# Patient Record
Sex: Female | Born: 1985 | Race: Black or African American | Hispanic: No | Marital: Single | Smoking: Former smoker
Health system: Southern US, Community
[De-identification: ages and names within clinical notes are randomized; demographics above are authoritative.]

## PROBLEM LIST (undated history)

## (undated) ENCOUNTER — Inpatient Hospital Stay (HOSPITAL_COMMUNITY): Payer: Self-pay

## (undated) DIAGNOSIS — R87619 Unspecified abnormal cytological findings in specimens from cervix uteri: Secondary | ICD-10-CM

## (undated) DIAGNOSIS — F419 Anxiety disorder, unspecified: Secondary | ICD-10-CM

## (undated) DIAGNOSIS — F32A Depression, unspecified: Secondary | ICD-10-CM

## (undated) DIAGNOSIS — A749 Chlamydial infection, unspecified: Secondary | ICD-10-CM

## (undated) DIAGNOSIS — IMO0002 Reserved for concepts with insufficient information to code with codable children: Secondary | ICD-10-CM

## (undated) DIAGNOSIS — F329 Major depressive disorder, single episode, unspecified: Secondary | ICD-10-CM

---
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
## 2008-09-25 ENCOUNTER — Ambulatory Visit: Payer: Self-pay | Admitting: Obstetrics & Gynecology

## 2008-09-25 ENCOUNTER — Observation Stay (HOSPITAL_COMMUNITY): Admission: AD | Admit: 2008-09-25 | Discharge: 2008-09-26 | Payer: Self-pay | Admitting: Obstetrics & Gynecology

## 2008-10-07 ENCOUNTER — Encounter: Payer: Self-pay | Admitting: Obstetrics & Gynecology

## 2008-10-07 ENCOUNTER — Ambulatory Visit: Payer: Self-pay | Admitting: Obstetrics & Gynecology

## 2008-10-07 LAB — CONVERTED CEMR LAB: Antibody Screen: NEGATIVE

## 2008-10-14 ENCOUNTER — Ambulatory Visit: Payer: Self-pay | Admitting: Obstetrics & Gynecology

## 2008-10-28 ENCOUNTER — Ambulatory Visit: Payer: Self-pay | Admitting: Obstetrics and Gynecology

## 2008-10-28 ENCOUNTER — Encounter: Payer: Self-pay | Admitting: Family

## 2008-10-28 LAB — CONVERTED CEMR LAB
Chlamydia, Swab/Urine, PCR: NEGATIVE
GC Probe Amp, Urine: NEGATIVE

## 2008-11-02 ENCOUNTER — Ambulatory Visit: Payer: Self-pay | Admitting: Advanced Practice Midwife

## 2008-11-02 ENCOUNTER — Inpatient Hospital Stay (HOSPITAL_COMMUNITY): Admission: AD | Admit: 2008-11-02 | Discharge: 2008-11-02 | Payer: Self-pay | Admitting: Obstetrics & Gynecology

## 2008-11-04 ENCOUNTER — Inpatient Hospital Stay (HOSPITAL_COMMUNITY): Admission: AD | Admit: 2008-11-04 | Discharge: 2008-11-07 | Payer: Self-pay | Admitting: Obstetrics & Gynecology

## 2008-11-04 ENCOUNTER — Ambulatory Visit: Payer: Self-pay | Admitting: Advanced Practice Midwife

## 2008-11-04 ENCOUNTER — Encounter: Payer: Self-pay | Admitting: Family

## 2008-11-04 ENCOUNTER — Ambulatory Visit: Payer: Self-pay | Admitting: Obstetrics and Gynecology

## 2009-06-28 ENCOUNTER — Inpatient Hospital Stay (HOSPITAL_COMMUNITY): Admission: EM | Admit: 2009-06-28 | Discharge: 2009-06-30 | Payer: Self-pay | Admitting: Emergency Medicine

## 2009-06-30 ENCOUNTER — Ambulatory Visit: Payer: Self-pay | Admitting: Psychiatry

## 2009-06-30 ENCOUNTER — Inpatient Hospital Stay (HOSPITAL_COMMUNITY): Admission: AD | Admit: 2009-06-30 | Discharge: 2009-07-01 | Payer: Self-pay | Admitting: Psychiatry

## 2010-01-29 ENCOUNTER — Encounter: Payer: Self-pay | Admitting: Cardiothoracic Surgery

## 2010-03-27 LAB — DIFFERENTIAL
Lymphs Abs: 2.1 10*3/uL (ref 0.7–4.0)
Monocytes Relative: 4 % (ref 3–12)
Neutro Abs: 5.3 10*3/uL (ref 1.7–7.7)
Neutrophils Relative %: 68 % (ref 43–77)

## 2010-03-27 LAB — BLOOD GAS, ARTERIAL
Bicarbonate: 18.4 mEq/L — ABNORMAL LOW (ref 20.0–24.0)
TCO2: 16.5 mmol/L (ref 0–100)
pCO2 arterial: 29.4 mmHg — ABNORMAL LOW (ref 35.0–45.0)
pH, Arterial: 7.412 — ABNORMAL HIGH (ref 7.350–7.400)
pO2, Arterial: 108 mmHg — ABNORMAL HIGH (ref 80.0–100.0)

## 2010-03-27 LAB — RAPID URINE DRUG SCREEN, HOSP PERFORMED
Benzodiazepines: NOT DETECTED
Cocaine: NOT DETECTED
Tetrahydrocannabinol: POSITIVE — AB

## 2010-03-27 LAB — CK TOTAL AND CKMB (NOT AT ARMC)
CK, MB: 0.5 ng/mL (ref 0.3–4.0)
Relative Index: 0.4 (ref 0.0–2.5)

## 2010-03-27 LAB — COMPREHENSIVE METABOLIC PANEL
Albumin: 3.1 g/dL — ABNORMAL LOW (ref 3.5–5.2)
Albumin: 4.1 g/dL (ref 3.5–5.2)
Alkaline Phosphatase: 41 U/L (ref 39–117)
BUN: 11 mg/dL (ref 6–23)
BUN: 5 mg/dL — ABNORMAL LOW (ref 6–23)
Calcium: 9.5 mg/dL (ref 8.4–10.5)
Glucose, Bld: 103 mg/dL — ABNORMAL HIGH (ref 70–99)
Potassium: 3.8 mEq/L (ref 3.5–5.1)
Sodium: 142 mEq/L (ref 135–145)
Total Protein: 6.1 g/dL (ref 6.0–8.3)
Total Protein: 7.6 g/dL (ref 6.0–8.3)

## 2010-03-27 LAB — CBC
HCT: 42.5 % (ref 36.0–46.0)
Hemoglobin: 11.6 g/dL — ABNORMAL LOW (ref 12.0–15.0)
MCHC: 32.8 g/dL (ref 30.0–36.0)
MCV: 93.1 fL (ref 78.0–100.0)
Platelets: 203 10*3/uL (ref 150–400)
RBC: 3.83 MIL/uL — ABNORMAL LOW (ref 3.87–5.11)
RBC: 4.57 MIL/uL (ref 3.87–5.11)
RDW: 14 % (ref 11.5–15.5)
WBC: 7.3 10*3/uL (ref 4.0–10.5)

## 2010-03-27 LAB — PREGNANCY, URINE: Preg Test, Ur: NEGATIVE

## 2010-03-27 LAB — TRICYCLICS SCREEN, URINE: TCA Scrn: POSITIVE — AB

## 2010-04-14 LAB — CBC
HCT: 32.9 % — ABNORMAL LOW (ref 36.0–46.0)
Hemoglobin: 11 g/dL — ABNORMAL LOW (ref 12.0–15.0)
MCHC: 33.4 g/dL (ref 30.0–36.0)
MCV: 94.3 fL (ref 78.0–100.0)
RDW: 12.7 % (ref 11.5–15.5)

## 2010-04-14 LAB — POCT URINALYSIS DIP (DEVICE)
Bilirubin Urine: NEGATIVE
Bilirubin Urine: NEGATIVE
Glucose, UA: NEGATIVE mg/dL
Hgb urine dipstick: NEGATIVE
Hgb urine dipstick: NEGATIVE
Ketones, ur: NEGATIVE mg/dL
Nitrite: NEGATIVE
Specific Gravity, Urine: 1.02 (ref 1.005–1.030)
Urobilinogen, UA: 0.2 mg/dL (ref 0.0–1.0)
Urobilinogen, UA: 0.2 mg/dL (ref 0.0–1.0)
pH: 6.5 (ref 5.0–8.0)
pH: 7 (ref 5.0–8.0)

## 2010-04-15 LAB — DIFFERENTIAL
Eosinophils Relative: 0 % (ref 0–5)
Lymphocytes Relative: 19 % (ref 12–46)
Lymphs Abs: 1.7 10*3/uL (ref 0.7–4.0)
Monocytes Absolute: 0.5 10*3/uL (ref 0.1–1.0)
Neutro Abs: 6.7 10*3/uL (ref 1.7–7.7)

## 2010-04-15 LAB — CBC
HCT: 32.5 % — ABNORMAL LOW (ref 36.0–46.0)
Hemoglobin: 11.1 g/dL — ABNORMAL LOW (ref 12.0–15.0)
Platelets: 216 10*3/uL (ref 150–400)
WBC: 8.9 10*3/uL (ref 4.0–10.5)

## 2010-04-15 LAB — RUBELLA SCREEN: Rubella: 21.3 IU/mL — ABNORMAL HIGH

## 2010-04-15 LAB — RPR: RPR Ser Ql: NONREACTIVE

## 2010-04-15 LAB — RAPID URINE DRUG SCREEN, HOSP PERFORMED
Opiates: NOT DETECTED
Tetrahydrocannabinol: POSITIVE — AB

## 2010-04-15 LAB — WET PREP, GENITAL

## 2010-04-15 LAB — POCT URINALYSIS DIP (DEVICE)
Bilirubin Urine: NEGATIVE
Glucose, UA: NEGATIVE mg/dL
Ketones, ur: NEGATIVE mg/dL
Nitrite: NEGATIVE
pH: 6.5 (ref 5.0–8.0)

## 2010-04-15 LAB — GLUCOSE TOLERANCE, 1 HOUR: Glucose, 1 Hour GTT: 115 mg/dL (ref 70–140)

## 2010-04-15 LAB — SICKLE CELL SCREEN: Sickle Cell Screen: NEGATIVE

## 2010-04-15 LAB — HEPATITIS B SURFACE ANTIGEN: Hepatitis B Surface Ag: NEGATIVE

## 2010-05-27 NOTE — H&P (Signed)
NAMEVINCIE, Abigail Mays          ACCOUNT NO.:  [id number]   MEDICAL RECORD NO.:  [id number]          PATIENT TYPE:  MAT   LOCATION:  [facility]                          FACILITY:  [facility]   PHYSICIAN:  Naima A. Dillard, M.D. DATE OF BIRTH:  Oct 03, 1985   DATE OF ADMISSION:  12/20/2005  DATE OF DISCHARGE:                              HISTORY & PHYSICAL   Abigail Mays is a 25 year old single black female, gravida 3, para 1-0-  1-1, at 70 and 2/7 weeks who presents with regular uterine contractions  tonight.  She denies leaking, denies bleeding, no signs or symptoms of  PIH.  Her pregnancy has been followed by [facility]  certified nurse mid wife service and has been remarkable for:  1. Questionable last menstrual period.  2. Late to care.  3. History of second trimester TAB.  4. Trichomonas at new OB.  5. Group B Strep positive.   Her prenatal labs were collected on October 11, 2005, with hemoglobin  11.6, hematocrit 33.3, platelets 268,000, blood type B positive, RPR  nonreactive, Rubella immune, hepatitis B surface antigen negative, HIV  nonreactive, Gonorrhea negative, Chlamydia negative.  One hour Glucola  from October 30, 2005, was 68, RPR at that time was nonreactive,  hemoglobin at that time was 11.2.  Culture of the vaginal tract for  group B Strep, Gonorrhea, and Chlamydia from November 29, 2005, group B  Strep was positive, Gonorrhea and Chlamydia were negative.   HISTORY OF PRESENT PREGNANCY:  The patient presented for care at [facility] on October 20, 2005, at 78 and 6/[redacted] weeks gestation.  The  patient had only been seen at [facility] prior to coming to  [place].  Ultrasound on this first visit shows growth actually  at 30 and 4/[redacted] weeks gestation with [facility] of December 25, 2005.  AFI  normal.  Ultrasonography at [redacted] weeks gestation shows estimated fetal  weight at the 67th percentile.  The rest of her prenatal care has been  unremarkable.   OB  HISTORY:  Gravida 3, para 1-0-1-1. In November 2005, she had a  vaginal delivery of a female infant weighing 8 pounds 6 ounces at 39  plus weeks gestation after five hours of labor.  She had an epidural for  anesthesia, infants name was Zania, there were no complications.  The  infant was born with [facility].  In March 2006, she had a 19  week elective AB.  This third pregnancy is a private pregnancy.   MEDICAL HISTORY:  She has no medication allergies.  She experienced  menarche at the age of 70 with 30 day cycles lasting five days.  She has  used oral contraceptives after her first pregnancy and after TAB used a  Nuva ring, stopped in November 2006.  She was treated for Chlamydia in  2000.  The patient was diagnosed in October with Trichomonas.   SURGICAL HISTORY:  Remarkable for elective AB.   FAMILY HISTORY:  Remarkable for a maternal grandmother with diabetes.  Maternal grandfather with liver cancer.   GENETIC HISTORY:  Negative.   SOCIAL HISTORY:  The  patient is single.  The father of the baby is  involved and supportive.  His name is Davon.  The patient has her GED  and is unemployed.  Father of the baby has a GED and works full time at  a service station.  They deny any alcohol, tobacco, or illicit drug use  with the pregnancy.   OBJECTIVE:  VITAL SIGNS:  Vital signs are stable.  She is afebrile.  HEENT:  Grossly within normal limits.  CHEST:  Clear to auscultation.  HEART:  Regular rate and rhythm.  ABDOMEN:  Gravid in contour, fundal height at approximately 39 cm above  the pubic symphysis, fetal heart rate is reactive and reassuring,  contractions every 3-4 minutes.  GU:  Cervix changed from 3 cm to 4 cm, 80%, vertex -2.  EXTREMITIES:  Normal.   ASSESSMENT:  1. Intrauterine pregnancy at term.  2. Early active labor.  3. Group B Strep positive.   PLAN:  1. Admit to birthing suite, Dr. Normand Sloop notified.  2. Routine CNM orders.  3. Plan penicillin-G for  group B Strep prophylaxis.  4. Anticipate normal spontaneous vaginal birth.      Cam Hai, C.N.M.      Naima A. Normand Sloop, M.D.  Electronically Signed    KS/MEDQ  D:  12/20/2005  T:  12/20/2005  Job:  [id number]

## 2010-10-24 ENCOUNTER — Emergency Department (HOSPITAL_COMMUNITY)
Admission: EM | Admit: 2010-10-24 | Discharge: 2010-10-24 | Disposition: A | Discharge status: Home / Self Care | Payer: Medicaid Other | Attending: Emergency Medicine | Admitting: Emergency Medicine

## 2010-10-24 DIAGNOSIS — B86 Scabies: Secondary | ICD-10-CM | POA: Insufficient documentation

## 2011-11-24 ENCOUNTER — Emergency Department (HOSPITAL_COMMUNITY)
Admission: EM | Admit: 2011-11-24 | Discharge: 2011-11-24 | Disposition: A | Discharge status: Home / Self Care | Payer: Self-pay | Attending: Emergency Medicine | Admitting: Emergency Medicine

## 2011-11-24 ENCOUNTER — Emergency Department (HOSPITAL_COMMUNITY): Payer: Self-pay

## 2011-11-24 ENCOUNTER — Encounter (HOSPITAL_COMMUNITY): Payer: Self-pay

## 2011-11-24 DIAGNOSIS — B9689 Other specified bacterial agents as the cause of diseases classified elsewhere: Secondary | ICD-10-CM | POA: Insufficient documentation

## 2011-11-24 DIAGNOSIS — N760 Acute vaginitis: Secondary | ICD-10-CM | POA: Insufficient documentation

## 2011-11-24 DIAGNOSIS — Z3490 Encounter for supervision of normal pregnancy, unspecified, unspecified trimester: Secondary | ICD-10-CM

## 2011-11-24 DIAGNOSIS — F411 Generalized anxiety disorder: Secondary | ICD-10-CM | POA: Insufficient documentation

## 2011-11-24 DIAGNOSIS — R0602 Shortness of breath: Secondary | ICD-10-CM | POA: Insufficient documentation

## 2011-11-24 DIAGNOSIS — A499 Bacterial infection, unspecified: Secondary | ICD-10-CM | POA: Insufficient documentation

## 2011-11-24 DIAGNOSIS — K5900 Constipation, unspecified: Secondary | ICD-10-CM | POA: Insufficient documentation

## 2011-11-24 DIAGNOSIS — R5381 Other malaise: Secondary | ICD-10-CM | POA: Insufficient documentation

## 2011-11-24 DIAGNOSIS — R109 Unspecified abdominal pain: Secondary | ICD-10-CM | POA: Insufficient documentation

## 2011-11-24 DIAGNOSIS — M549 Dorsalgia, unspecified: Secondary | ICD-10-CM | POA: Insufficient documentation

## 2011-11-24 DIAGNOSIS — R5383 Other fatigue: Secondary | ICD-10-CM | POA: Insufficient documentation

## 2011-11-24 DIAGNOSIS — O2390 Unspecified genitourinary tract infection in pregnancy, unspecified trimester: Secondary | ICD-10-CM | POA: Insufficient documentation

## 2011-11-24 LAB — URINALYSIS, ROUTINE W REFLEX MICROSCOPIC
Glucose, UA: NEGATIVE mg/dL
Protein, ur: 30 mg/dL — AB
Specific Gravity, Urine: 1.038 — ABNORMAL HIGH (ref 1.005–1.030)
Urobilinogen, UA: 1 mg/dL (ref 0.0–1.0)

## 2011-11-24 LAB — COMPREHENSIVE METABOLIC PANEL
ALT: 15 U/L (ref 0–35)
AST: 15 U/L (ref 0–37)
Albumin: 4 g/dL (ref 3.5–5.2)
CO2: 23 mEq/L (ref 19–32)
Chloride: 97 mEq/L (ref 96–112)
Creatinine, Ser: 0.75 mg/dL (ref 0.50–1.10)
GFR calc non Af Amer: >90 mL/min (ref >90)
Sodium: 133 mEq/L — ABNORMAL LOW (ref 135–145)
Total Bilirubin: 0.4 mg/dL (ref 0.3–1.2)

## 2011-11-24 LAB — CBC WITH DIFFERENTIAL/PLATELET
Basophils Absolute: 0 10*3/uL (ref 0.0–0.1)
Basophils Relative: 0 % (ref 0–1)
HCT: 41.6 % (ref 36.0–46.0)
Lymphocytes Relative: 21 % (ref 12–46)
MCHC: 35.3 g/dL (ref 30.0–36.0)
Neutro Abs: 7.6 10*3/uL (ref 1.7–7.7)
Neutrophils Relative %: 72 % (ref 43–77)
Platelets: 292 10*3/uL (ref 150–400)
RDW: 12.8 % (ref 11.5–15.5)
WBC: 10.5 10*3/uL (ref 4.0–10.5)

## 2011-11-24 LAB — URINE MICROSCOPIC-ADD ON

## 2011-11-24 LAB — LIPASE, BLOOD: Lipase: 34 U/L (ref 11–59)

## 2011-11-24 LAB — WET PREP, GENITAL

## 2011-11-24 LAB — OB RESULTS CONSOLE GC/CHLAMYDIA: Gonorrhea: POSITIVE

## 2011-11-24 MED ORDER — METRONIDAZOLE 500 MG PO TABS: 500.0000 mg | ORAL_TABLET | Freq: Two times a day (BID) | ORAL | Status: DC

## 2011-11-24 MED ORDER — ONDANSETRON HCL 4 MG PO TABS: 4.0000 mg | ORAL_TABLET | Freq: Four times a day (QID) | ORAL | Status: DC

## 2011-11-24 MED ORDER — SODIUM CHLORIDE 0.9 % IV BOLUS (SEPSIS)
1000.0000 mL | Freq: Once | INTRAVENOUS | Status: AC
  Administered 2011-11-24: 1000 mL via INTRAVENOUS

## 2011-11-24 MED ORDER — HYDROMORPHONE HCL PF 1 MG/ML IJ SOLN
1.0000 mg | Freq: Once | INTRAMUSCULAR | Status: AC
  Administered 2011-11-24: 1 mg via INTRAVENOUS
  Filled 2011-11-24: qty 1

## 2011-11-24 MED ORDER — ONDANSETRON HCL 4 MG/2ML IJ SOLN
4.0000 mg | Freq: Once | INTRAMUSCULAR | Status: AC
  Administered 2011-11-24: 4 mg via INTRAVENOUS
  Filled 2011-11-24: qty 2

## 2011-11-24 NOTE — ED Provider Notes (Signed)
Medical screening examination/treatment/procedure(s) were performed by non-physician practitioner and as supervising physician I was immediately available for consultation/collaboration.   Dione Booze, MD 11/24/11 [phone]

## 2011-11-24 NOTE — ED Provider Notes (Signed)
History     CSN: [id number]  Arrival date & time 11/24/11  7829   First MD Initiated Contact with Patient 11/24/11 [phone]      Chief Complaint  Patient presents with  . Emesis    (Consider location/radiation/quality/duration/timing/severity/associated sxs/prior treatment) HPI Comments: 26 year old female presents the emergency department complaining of nausea and vomiting since October 29. States since October 29 she has been unable to keep any food or liquid down. Try drinking ginger ale, however she was unable to keep down either. Admits to associated constant abdominal pain generalized throughout her entire abdomen worse in the lower aspect of her abdomen. Describes pain as a dull ache with occasional sharp feeling rated 7/10 occasionally radiating to her back. She has not tried any alleviating factors to that she cannot keep anything down. Last menstrual period was at the end of September, and states there is a chance of pregnancy. She is not on oral contraceptives and is sexually active with one partner without protection. Admits not having a bowel movement for the past 4 days. Denies vaginal bleeding, odor, itching or discharge. Denies any urinary symptoms.  Patient is a 26 y.o. female presenting with vomiting. The history is provided by the patient.  Emesis  Associated symptoms include abdominal pain.    History reviewed. No pertinent past medical history.  History reviewed. No pertinent past surgical history.  History reviewed. No pertinent family history.  History  Substance Use Topics  . Smoking status: Never Smoker   . Smokeless tobacco: Not on file  . Alcohol Use: No    OB History    Grav Para Term Preterm Abortions TAB SAB Ect Mult Living                  Review of Systems  Constitutional: Positive for appetite change and fatigue.  HENT: Negative.   Eyes: Negative.   Respiratory: Positive for shortness of breath.   Cardiovascular: Negative for chest pain.    Gastrointestinal: Positive for nausea, vomiting, abdominal pain and constipation. Negative for abdominal distention.  Genitourinary: Positive for menstrual problem. Negative for dysuria, urgency, frequency, hematuria, vaginal bleeding, vaginal discharge and vaginal pain.  Musculoskeletal: Positive for back pain.  Skin: Negative.   Neurological: Positive for weakness.  Psychiatric/Behavioral: Negative.     Allergies  Review of patient's allergies indicates not on file.  Home Medications  No current outpatient prescriptions on file.  BP 135/89  Pulse 107  Temp 98.4 F (36.9 C) (Oral)  Resp 20  SpO2 99%  LMP 09/26/2011  Physical Exam  Constitutional: She is oriented to person, place, and time. She appears well-developed and well-nourished. No distress.       Appears uncomfortable  HENT:  Head: Normocephalic and atraumatic.  Mouth/Throat: Oropharynx is clear and moist.  Eyes: Conjunctivae normal and EOM are normal. Pupils are equal, round, and reactive to light.  Neck: Normal range of motion. Neck supple.  Cardiovascular: Regular rhythm, normal heart sounds and intact distal pulses.  Tachycardia present.  Exam reveals no decreased pulses.   Pulmonary/Chest: Effort normal and breath sounds normal. No respiratory distress. She has no decreased breath sounds.  Abdominal: There is generalized tenderness (worse mid-epigastric and suprapubic). There is guarding. There is no rigidity (no peritoneal signs), no rebound and no CVA tenderness.  Genitourinary: Uterus is tender. Cervix exhibits discharge. Cervix exhibits no motion tenderness and no friability. Right adnexum displays tenderness (mild). Right adnexum displays no mass and no fullness. Left adnexum displays tenderness (mild). Left  adnexum displays no mass and no fullness. No erythema, tenderness or bleeding around the vagina. Vaginal discharge (copious, white, malodorous) found.  Musculoskeletal: Normal range of motion.   Neurological: She is alert and oriented to person, place, and time.  Skin: Skin is warm and dry.  Psychiatric: Her speech is normal and behavior is normal. Her mood appears anxious.       Tearful    ED Course  Procedures (including critical care time)  Labs Reviewed  COMPREHENSIVE METABOLIC PANEL - Abnormal; Notable for the following:    Sodium 133 (*)     Potassium 3.3 (*)     Total Protein 8.8 (*)     All other components within normal limits  URINALYSIS, ROUTINE W REFLEX MICROSCOPIC - Abnormal; Notable for the following:    APPearance CLOUDY (*)     Specific Gravity, Urine 1.038 (*)     Hgb urine dipstick MODERATE (*)     Bilirubin Urine SMALL (*)     Ketones, ur 15 (*)     Protein, ur 30 (*)     Leukocytes, UA SMALL (*)     All other components within normal limits  URINE MICROSCOPIC-ADD ON - Abnormal; Notable for the following:    Squamous Epithelial / LPF MANY (*)     Bacteria, UA FEW (*)     All other components within normal limits  POCT PREGNANCY, URINE - Abnormal; Notable for the following:    Preg Test, Ur POSITIVE (*)     All other components within normal limits  WET PREP, GENITAL - Abnormal; Notable for the following:    Clue Cells Wet Prep HPF POC MODERATE (*)     WBC, Wet Prep HPF POC TOO NUMEROUS TO COUNT (*)     All other components within normal limits  CBC WITH DIFFERENTIAL  LIPASE, BLOOD  GC/CHLAMYDIA PROBE AMP   US Ob Comp Less 14 Wks  11/24/2011  *RADIOLOGY REPORT*  Clinical Data: Emesis and pelvic cramping.  OBSTETRIC <14 WK [place] AND TRANSVAGINAL OB US  Technique:  Both transabdominal and transvaginal ultrasound examinations were performed for complete evaluation of the gestation as well as the maternal uterus, adnexal regions, and pelvic cul-de-sac.  Transvaginal technique was performed to assess early pregnancy.  Comparison:  09/25/2008  Intrauterine gestational sac:  Visualized/normal in shape. Yolk sac: Present Embryo: Present Cardiac Activity:  Present Heart Rate: 152 bpm CRL: 20.7  mm  8 w  5 d             [place] EDC: 06/30/2012  Maternal uterus/adnexae: Normal appearance of both ovaries. There is a nodular structure along the anterior aspect of the uterus that is suggestive for a fibroid.  This structure roughly measures 2.3 x 1.6 x 2.6 cm. There are prominent vascular structures throughout the pelvis.  IMPRESSION: Single intrauterine pregnancy.  Gestational age by ultrasound is 8 weeks and 5 days.  Uterine fibroid measuring up to 2.6 cm.  Prominent vascular structures throughout the pelvis.   Original Report Authenticated By: Richarda Overlie, M.D.    US Ob Transvaginal  11/24/2011  *RADIOLOGY REPORT*  Clinical Data: Emesis and pelvic cramping.  OBSTETRIC <14 WK [place] AND TRANSVAGINAL OB US  Technique:  Both transabdominal and transvaginal ultrasound examinations were performed for complete evaluation of the gestation as well as the maternal uterus, adnexal regions, and pelvic cul-de-sac.  Transvaginal technique was performed to assess early pregnancy.  Comparison:  09/25/2008  Intrauterine gestational sac:  Visualized/normal in shape. Yolk  sac: Present Embryo: Present Cardiac Activity: Present Heart Rate: 152 bpm CRL: 20.7  mm  8 w  5 d             [place] EDC: 06/30/2012  Maternal uterus/adnexae: Normal appearance of both ovaries. There is a nodular structure along the anterior aspect of the uterus that is suggestive for a fibroid.  This structure roughly measures 2.3 x 1.6 x 2.6 cm. There are prominent vascular structures throughout the pelvis.  IMPRESSION: Single intrauterine pregnancy.  Gestational age by ultrasound is 8 weeks and 5 days.  Uterine fibroid measuring up to 2.6 cm.  Prominent vascular structures throughout the pelvis.   Original Report Authenticated By: Richarda Overlie, M.D.      1. BV (bacterial vaginosis)   2. Pregnancy       MDM  8 week 5 days IUP confirmed. Wet prep with moderate clue cells. Will treat for BV with flagyl. This is patient's  third pregnancy. She does not have an OB/GYN at this time. Zofran given for nausea. Pain and nausea controlled with dilaudid and zofran. Advised f/u with women's outpatient clinic.         Trevor Mace, PA-C 11/24/11 1220

## 2011-11-24 NOTE — ED Notes (Signed)
Pt sts she has been having symptoms of vomiting and unable to eat or drink since two days before Halloween.  Pt sts there is a possibility of pregnancy.

## 2011-12-06 ENCOUNTER — Inpatient Hospital Stay (HOSPITAL_COMMUNITY)
Admission: AD | Admit: 2011-12-06 | Discharge: 2011-12-06 | Disposition: A | Discharge status: Home / Self Care | Payer: Medicaid Other | Source: Ambulatory Visit | Attending: Obstetrics and Gynecology | Admitting: Obstetrics and Gynecology

## 2011-12-06 ENCOUNTER — Encounter (HOSPITAL_COMMUNITY): Payer: Self-pay | Admitting: *Deleted

## 2011-12-06 DIAGNOSIS — O98319 Other infections with a predominantly sexual mode of transmission complicating pregnancy, unspecified trimester: Secondary | ICD-10-CM | POA: Insufficient documentation

## 2011-12-06 DIAGNOSIS — O210 Mild hyperemesis gravidarum: Secondary | ICD-10-CM | POA: Insufficient documentation

## 2011-12-06 DIAGNOSIS — R1013 Epigastric pain: Secondary | ICD-10-CM | POA: Insufficient documentation

## 2011-12-06 DIAGNOSIS — A5619 Other chlamydial genitourinary infection: Secondary | ICD-10-CM | POA: Insufficient documentation

## 2011-12-06 DIAGNOSIS — A749 Chlamydial infection, unspecified: Secondary | ICD-10-CM

## 2011-12-06 DIAGNOSIS — N739 Female pelvic inflammatory disease, unspecified: Secondary | ICD-10-CM | POA: Insufficient documentation

## 2011-12-06 HISTORY — DX: Reserved for concepts with insufficient information to code with codable children: IMO0002

## 2011-12-06 HISTORY — DX: Unspecified abnormal cytological findings in specimens from cervix uteri: R87.619

## 2011-12-06 HISTORY — DX: Depression, unspecified: F32.A

## 2011-12-06 HISTORY — DX: Anxiety disorder, unspecified: F41.9

## 2011-12-06 HISTORY — DX: Major depressive disorder, single episode, unspecified: F32.9

## 2011-12-06 HISTORY — DX: Chlamydial infection, unspecified: A74.9

## 2011-12-06 LAB — URINALYSIS, ROUTINE W REFLEX MICROSCOPIC
Leukocytes, UA: NEGATIVE
Nitrite: NEGATIVE
Specific Gravity, Urine: 1.02 (ref 1.005–1.030)
Urobilinogen, UA: 0.2 mg/dL (ref 0.0–1.0)
pH: 6.5 (ref 5.0–8.0)

## 2011-12-06 LAB — URINE MICROSCOPIC-ADD ON

## 2011-12-06 MED ORDER — AZITHROMYCIN 250 MG PO TABS
1000.0000 mg | ORAL_TABLET | Freq: Once | ORAL | Status: AC
  Administered 2011-12-06: 1000 mg via ORAL
  Filled 2011-12-06: qty 4

## 2011-12-06 MED ORDER — ONDANSETRON 8 MG PO TBDP: 8.0000 mg | ORAL_TABLET | Freq: Three times a day (TID) | ORAL | Status: DC | PRN

## 2011-12-06 MED ORDER — PROMETHAZINE HCL 25 MG PO TABS
25.0000 mg | ORAL_TABLET | Freq: Four times a day (QID) | ORAL | Status: DC | PRN
Start: 2011-12-06 — End: 2012-03-19

## 2011-12-06 MED ORDER — SODIUM CHLORIDE 0.9 % IV SOLN
25.0000 mg | Freq: Once | INTRAVENOUS | Status: AC
  Administered 2011-12-06: 25 mg via INTRAVENOUS
  Filled 2011-12-06: qty 1

## 2011-12-06 MED ORDER — AZITHROMYCIN 250 MG PO TABS: ORAL_TABLET | ORAL | Status: DC

## 2011-12-06 NOTE — MAU Note (Signed)
Plans to go to [facility]. Not currently a patient, but goes there when pregnant.

## 2011-12-06 NOTE — MAU Note (Addendum)
Vomiting X 2 wks. Pain in lower stomach off and on. Worse since yesterday. Hurts when she walk, coughs. States she was prescribed Flagyl for BV but was unable to tolerate. States she was prescribed Zofran, but could not afford so it was changed to ? Phenergan. States it did not work. Pt. Currently has dry heaves and spitting.

## 2011-12-06 NOTE — MAU Provider Note (Signed)
  History     CSN: [id number]  Arrival date and time: 12/06/11 1740   First Provider Initiated Contact with Patient 12/06/11 1902      Chief Complaint  Patient presents with  . Emesis During Pregnancy   HPI Abigail Mays 26 y.o. [redacted]w[redacted]d Comes to MAU today with repetitive vomiting all day.  Has chest pain.  Has not felt good.  Was seen at [facility] ER on 11-24-11 for UTI and BV in pregnancy.  Has not had vomiting in pregnancy before.  Has not started prenatal care.  OB History    Grav Para Term Preterm Abortions TAB SAB Ect Mult Living   4 3 3       3       Past Medical History  Diagnosis Date  . Abnormal Pap smear   . Anxiety   . Depression     No meds  . Chlamydia     History reviewed. No pertinent past surgical history.  History reviewed. No pertinent family history.  History  Substance Use Topics  . Smoking status: Never Smoker   . Smokeless tobacco: Never Used  . Alcohol Use: No    Allergies: No Known Allergies  Prescriptions prior to admission  Medication Sig Dispense Refill  . metroNIDAZOLE (FLAGYL) 500 MG tablet Take 1 tablet (500 mg total) by mouth 2 (two) times daily. One po bid x 7 days  14 tablet  0    Review of Systems  Constitutional: Positive for malaise/fatigue.  Gastrointestinal: Positive for nausea and vomiting. Negative for abdominal pain, diarrhea and constipation.       Pain in epigastric area midline in upper abdomen  Genitourinary:       No vaginal discharge. No vaginal bleeding. No dysuria.  Neurological: Positive for weakness.   Physical Exam   Blood pressure 127/72, pulse 84, temperature 97.2 F (36.2 C), temperature source Oral, resp. rate 18, height 5\' 6"  (1.676 m), weight 79.561 kg (175 lb 6.4 oz), last menstrual period 09/26/2011, SpO2 100.00%.  Physical Exam  Nursing note and vitals reviewed. Constitutional: She is oriented to person, place, and time. She appears well-developed and well-nourished.  HENT:  Head:  Normocephalic.  Eyes: EOM are normal.  Neck: Neck supple.  GI: Soft. There is no tenderness.  Musculoskeletal: Normal range of motion.  Neurological: She is alert and oriented to person, place, and time.  Skin: Skin is warm and dry.  Psychiatric: She has a normal mood and affect.    MAU Course  Procedures  MDM Pain in epigastric area likely due to repetitive vomiting and heartburn.  Reviewed chart from [facility] - positive chlamydia.  Reviewed results with client.  Has not been treated for chlamydia.  Will give IVF with Phenergan to control vomiting so treatment for chlamydia or prescription for treatment for chlamydia can be given.  Vomiting in MAU.  2000  Care assumed by S. Chase Picket, NP  Assessment and Plan    Abigail Mays 12/06/2011, 7:05 PM

## 2011-12-09 NOTE — MAU Provider Note (Signed)
Attestation of Attending Supervision of Advanced Practitioner: Evaluation and management procedures were performed by the PA/NP/CNM/OB Fellow under my supervision/collaboration. Chart reviewed and agree with management and plan.  Shamel Germond V 12/09/2011 7:07 PM

## 2012-01-10 NOTE — L&D Delivery Note (Signed)
Delivery Note At 2:52 PM a viable female was delivered via Vaginal, Spontaneous Delivery (Presentation: Occiput Anterior).  APGAR: 8,9 ; weight - 3410 g. Placenta status: Intact, Spontaneous.  Cord: 3 vessels with the following complications: None.  Cord pH: N/A  Anesthesia: Epidural  Episiotomy: None Lacerations: None Est. Blood Loss (mL): 150 mL  Mom to postpartum.  Baby to nursery-stable.  [name] 06/25/2012, 3:04 PM Baby for adoption. For BTL today.  Evaluation and management procedures were performed by Resident physician under my supervision/collaboration. Chart reviewed, patient examined by me and I agree with management and plan. Danae Orleans, CNM 06/25/2012 4:33 PM

## 2012-03-19 ENCOUNTER — Encounter (HOSPITAL_COMMUNITY): Payer: Self-pay | Admitting: *Deleted

## 2012-03-19 ENCOUNTER — Inpatient Hospital Stay (HOSPITAL_COMMUNITY)
Admission: AD | Admit: 2012-03-19 | Discharge: 2012-03-19 | Disposition: A | Discharge status: Home / Self Care | Payer: Medicaid Other | Source: Ambulatory Visit | Attending: Obstetrics & Gynecology | Admitting: Obstetrics & Gynecology

## 2012-03-19 DIAGNOSIS — A499 Bacterial infection, unspecified: Secondary | ICD-10-CM | POA: Insufficient documentation

## 2012-03-19 DIAGNOSIS — B9689 Other specified bacterial agents as the cause of diseases classified elsewhere: Secondary | ICD-10-CM

## 2012-03-19 DIAGNOSIS — N760 Acute vaginitis: Secondary | ICD-10-CM

## 2012-03-19 DIAGNOSIS — O2390 Unspecified genitourinary tract infection in pregnancy, unspecified trimester: Secondary | ICD-10-CM | POA: Insufficient documentation

## 2012-03-19 DIAGNOSIS — O99891 Other specified diseases and conditions complicating pregnancy: Secondary | ICD-10-CM | POA: Insufficient documentation

## 2012-03-19 DIAGNOSIS — R109 Unspecified abdominal pain: Secondary | ICD-10-CM | POA: Insufficient documentation

## 2012-03-19 LAB — URINALYSIS, ROUTINE W REFLEX MICROSCOPIC
Bilirubin Urine: NEGATIVE
Glucose, UA: NEGATIVE mg/dL
Ketones, ur: 15 mg/dL — AB
Protein, ur: NEGATIVE mg/dL
Urobilinogen, UA: 0.2 mg/dL (ref 0.0–1.0)

## 2012-03-19 LAB — URINE MICROSCOPIC-ADD ON

## 2012-03-19 LAB — WET PREP, GENITAL
Trich, Wet Prep: NONE SEEN
Yeast Wet Prep HPF POC: NONE SEEN

## 2012-03-19 MED ORDER — METRONIDAZOLE 500 MG PO TABS: 500.0000 mg | ORAL_TABLET | Freq: Two times a day (BID) | ORAL | Status: DC

## 2012-03-19 NOTE — MAU Note (Signed)
Name and DOB verified, correct spelling on armband confirmed by PT.

## 2012-03-19 NOTE — MAU Note (Signed)
Having a lot of cramping and pressure. Has preg medicaid, but hasn't gotten card yet so hasn't been to dr.

## 2012-03-19 NOTE — MAU Note (Signed)
Pt states she has been feeling abdominal pain for "the last 2-3 days".

## 2012-03-19 NOTE — MAU Provider Note (Signed)
History     CSN: [id number]  Arrival date and time: 03/19/12 1836   First Christhoper Busbee Initiated Contact with Patient 03/19/12 1941      Chief Complaint  Patient presents with  . Abdominal Pain   HPI This is a 27 y.o. female at [redacted]w[redacted]d who presents with c/o cramping for two days. No history of preterm labor. Denies leaking or bleeding. Does not drink much at all because she doesn't think about it. Plans care at [facility] but waiting for Medicaid card.  OB History   Grav Para Term Preterm Abortions TAB SAB Ect Mult Living   4 3 3       3       Past Medical History  Diagnosis Date  . Abnormal Pap smear   . Anxiety   . Depression     No meds  . Chlamydia     History reviewed. No pertinent past surgical history.  History reviewed. No pertinent family history.  History  Substance Use Topics  . Smoking status: Never Smoker   . Smokeless tobacco: Never Used  . Alcohol Use: No    Allergies: No Known Allergies  Prescriptions prior to admission  Medication Sig Dispense Refill  . azithromycin (ZITHROMAX Z-PAK) 250 MG tablet take 4 tablets at one time  4 each  0  . metroNIDAZOLE (FLAGYL) 500 MG tablet Take 1 tablet (500 mg total) by mouth 2 (two) times daily. One po bid x 7 days  14 tablet  0  . ondansetron (ZOFRAN ODT) 8 MG disintegrating tablet Take 1 tablet (8 mg total) by mouth every 8 (eight) hours as needed for nausea.  20 tablet  0  . promethazine (PHENERGAN) 25 MG tablet Take 1 tablet (25 mg total) by mouth every 6 (six) hours as needed for nausea.  30 tablet  0    Review of Systems  Constitutional: Negative for fever, chills and malaise/fatigue.  Gastrointestinal: Positive for abdominal pain. Negative for nausea, vomiting, diarrhea and constipation.  Genitourinary: Negative for dysuria.  Musculoskeletal: Negative for myalgias.  Neurological: Negative for dizziness, weakness and headaches.   Physical Exam   Blood pressure 121/59, pulse 80, temperature 98.3 F (36.8 C),  temperature source Oral, resp. rate 18, height 5\' 4"  (1.626 m), weight 182 lb (82.555 kg), last menstrual period 09/26/2011.  Physical Exam  Constitutional: She is oriented to person, place, and time. She appears well-developed and well-nourished. No distress.  HENT:  Head: Normocephalic.  Cardiovascular: Normal rate.   Respiratory: Effort normal.  GI: Soft. She exhibits no distension and no mass. There is no tenderness. There is no rebound and no guarding.  Genitourinary: Uterus normal. Vaginal discharge (milky white with whiff) found.  FHR reassuring Uterine irritability seen off and on Cervix long closed and high  Musculoskeletal: Normal range of motion.  Neurological: She is alert and oriented to person, place, and time.  Skin: Skin is warm and dry.  Psychiatric: She has a normal mood and affect.    MAU Course  Procedures  MDM Wet prep done. Cultures repeated due to + Chlamydia in November. (denies repeat exposure)  Assessment and Plan  A:  SIUP at [redacted]w[redacted]d       Uterine irritability      Suspect BV P:   WILLIAMS,MARIE 03/19/2012, 7:52 PM  Results for orders placed during the hospital encounter of 03/19/12 (from the past 72 hour(s))  URINALYSIS, ROUTINE W REFLEX MICROSCOPIC     Status: Abnormal   Collection Time  03/19/12  7:00 PM      Result Value Range   Color, Urine YELLOW  YELLOW   APPearance CLEAR  CLEAR   Specific Gravity, Urine 1.020  1.005 - 1.030   pH 6.0  5.0 - 8.0   Glucose, UA NEGATIVE  NEGATIVE mg/dL   Hgb urine dipstick TRACE (*) NEGATIVE   Bilirubin Urine NEGATIVE  NEGATIVE   Ketones, ur 15 (*) NEGATIVE mg/dL   Protein, ur NEGATIVE  NEGATIVE mg/dL   Urobilinogen, UA 0.2  0.0 - 1.0 mg/dL   Nitrite NEGATIVE  NEGATIVE   Leukocytes, UA NEGATIVE  NEGATIVE  URINE MICROSCOPIC-ADD ON     Status: None   Collection Time    03/19/12  7:00 PM      Result Value Range   Squamous Epithelial / LPF RARE  RARE   WBC, UA 0-2  <3 WBC/hpf   RBC / HPF 0-2  <3  RBC/hpf   Bacteria, UA RARE  RARE   Urine-Other MUCOUS PRESENT    WET PREP, GENITAL     Status: Abnormal   Collection Time    03/19/12  7:50 PM      Result Value Range   Yeast Wet Prep HPF POC NONE SEEN  NONE SEEN   Trich, Wet Prep NONE SEEN  NONE SEEN   Clue Cells Wet Prep HPF POC FEW (*) NONE SEEN   WBC, Wet Prep HPF POC FEW (*) NONE SEEN   Comment: MODERATE BACTERIA SEEN   Will treat for BV and recommend she call [facility] for care Preterm Labor precautions

## 2012-03-20 LAB — GC/CHLAMYDIA PROBE AMP: GC Probe RNA: NEGATIVE

## 2012-03-21 NOTE — MAU Provider Note (Signed)

## 2012-05-13 ENCOUNTER — Inpatient Hospital Stay (HOSPITAL_COMMUNITY)
Admission: AD | Admit: 2012-05-13 | Discharge: 2012-05-13 | Disposition: A | Discharge status: Home / Self Care | Payer: Medicaid Other | Source: Ambulatory Visit | Attending: Family Medicine | Admitting: Family Medicine

## 2012-05-13 ENCOUNTER — Inpatient Hospital Stay (HOSPITAL_COMMUNITY): Payer: Medicaid Other

## 2012-05-13 ENCOUNTER — Encounter (HOSPITAL_COMMUNITY): Payer: Self-pay

## 2012-05-13 DIAGNOSIS — O0933 Supervision of pregnancy with insufficient antenatal care, third trimester: Secondary | ICD-10-CM

## 2012-05-13 DIAGNOSIS — K219 Gastro-esophageal reflux disease without esophagitis: Secondary | ICD-10-CM

## 2012-05-13 DIAGNOSIS — O212 Late vomiting of pregnancy: Secondary | ICD-10-CM | POA: Insufficient documentation

## 2012-05-13 DIAGNOSIS — K920 Hematemesis: Secondary | ICD-10-CM

## 2012-05-13 DIAGNOSIS — R109 Unspecified abdominal pain: Secondary | ICD-10-CM | POA: Insufficient documentation

## 2012-05-13 LAB — URINALYSIS, ROUTINE W REFLEX MICROSCOPIC
Bilirubin Urine: NEGATIVE
Glucose, UA: NEGATIVE mg/dL
Ketones, ur: 15 mg/dL — AB
Protein, ur: 30 mg/dL — AB
pH: 6.5 (ref 5.0–8.0)

## 2012-05-13 LAB — CBC
HCT: 29.6 % — ABNORMAL LOW (ref 36.0–46.0)
Hemoglobin: 9.9 g/dL — ABNORMAL LOW (ref 12.0–15.0)
MCV: 90.8 fL (ref 78.0–100.0)
WBC: 12.4 10*3/uL — ABNORMAL HIGH (ref 4.0–10.5)

## 2012-05-13 LAB — URINE MICROSCOPIC-ADD ON

## 2012-05-13 LAB — WET PREP, GENITAL
Clue Cells Wet Prep HPF POC: NONE SEEN
Trich, Wet Prep: NONE SEEN

## 2012-05-13 MED ORDER — PRENATAL VITAMINS 0.8 MG PO TABS: 1.0000 | ORAL_TABLET | Freq: Every day | ORAL | Status: DC

## 2012-05-13 MED ORDER — PANTOPRAZOLE SODIUM 40 MG PO TBEC
40.0000 mg | DELAYED_RELEASE_TABLET | Freq: Every day | ORAL | Status: DC
  Filled 2012-05-13 (×2): qty 1

## 2012-05-13 MED ORDER — METOCLOPRAMIDE HCL 10 MG PO TABS: 10.0000 mg | ORAL_TABLET | Freq: Four times a day (QID) | ORAL | Status: DC | PRN

## 2012-05-13 MED ORDER — PANTOPRAZOLE SODIUM 40 MG PO TBEC: 40.0000 mg | DELAYED_RELEASE_TABLET | Freq: Every day | ORAL | Status: DC

## 2012-05-13 NOTE — MAU Note (Signed)
Lower abd pain x2 weeks. Has had hyperemesis with entire pregnancy. Denies vag d/c changes or bleeding

## 2012-05-13 NOTE — MAU Provider Note (Signed)
History     CSN: [id number]  Arrival date & time 05/13/12  1610   None     Chief Complaint  Patient presents with  . Emesis  . Abdominal Pain    HPI  Abigail Mays is a 27 y.o. [phone] at [redacted]w[redacted]d who presents today with blood in her vomit.  Patient states she has vomited throughout her pregnancy. Today she vomited twice and during the second time she "poured out blood". This was the first time the bloody vomit has happened. She estimates about 1/2 of a cup of red blood. Is not in any pain.  Says she is having a little bit of contractions but they are not strong. Her stomach gets tight then it goes away. She is not able to quantify how often this happens. Denies dysuria or fevers. Normal PO intake. Normal urination and bowel movements. No blood in her stool. Has also had a sharp pain in her lower stomach on and off for the last week. Is not having vaginal bleeding or leaking fluid. Just felt baby move a few minutes ago.  Denies having any problems during this pregnancy except that she has not had any prenatal care. She had an ultrasound in November but no other ultrasounds.    Past Medical History  Diagnosis Date  . Abnormal Pap smear   . Anxiety   . Depression     No meds  . Chlamydia     Past Surgical History  Procedure Laterality Date  . No past surgeries    . Wisdom tooth extraction      History reviewed. No pertinent family history.  History  Substance Use Topics  . Smoking status: Never Smoker   . Smokeless tobacco: Never Used  . Alcohol Use: No  used to smoke MJ before found out pregnant  OB History   Grav Para Term Preterm Abortions TAB SAB Ect Mult Living   4 3 3       3     vaginal deliveries - 3 term  Review of Systems  Allergies  Review of patient's allergies indicates no known allergies.  Home Medications  No current outpatient prescriptions on file. no meds or vit  BP 108/61  Pulse 95  Temp(Src) 99 F (37.2 C) (Oral)  Resp 16  Ht 5'  5" (1.651 m)  Wt 189 lb 9.6 oz (86.002 kg)  BMI 31.55 kg/m2  SpO2 100%  LMP 09/26/2011  Physical Exam Gen: NAD Heart: RRR Lungs: CTAB, NWOB Abd: gravid but otherwise soft, nontender to palpation Ext: no appreciable lower extremity edema bilaterally Neuro: grossly nonfocal, speech intact GU: normal appearing external genitalia. Thick white discharge present in the vagina. Cervix appears erythematous/irritated. Blood small blood present on swab for gc/chlamydia. Cervix visually appears somewhat opened.    MAU Course  Procedures (including critical care time)  Labs Reviewed  WET PREP, GENITAL - Abnormal; Notable for the following:    WBC, Wet Prep HPF POC FEW (*)    All other components within normal limits  URINALYSIS, ROUTINE W REFLEX MICROSCOPIC - Abnormal; Notable for the following:    APPearance HAZY (*)    Specific Gravity, Urine >1.030 (*)    Ketones, ur 15 (*)    Protein, ur 30 (*)    Leukocytes, UA TRACE (*)    All other components within normal limits  URINE MICROSCOPIC-ADD ON - Abnormal; Notable for the following:    Squamous Epithelial / LPF FEW (*)    All other components  within normal limits  CBC - Abnormal; Notable for the following:    WBC 12.4 (*)    RBC 3.26 (*)    Hemoglobin 9.9 (*)    HCT 29.6 (*)    All other components within normal limits  GC/CHLAMYDIA PROBE AMP   US Ob Limited  05/13/2012  *RADIOLOGY REPORT*  Clinical Data:  Pregnant patient vomiting blood.  LIMITED OBSTETRIC ULTRASOUND  Number of Fetuses: 1 Heart Rate: 142bpm Movement:  Visualized. Presentation: Cephalic Placental Location: Posterior Previa: None. Amniotic Fluid (Subjective): Normal. AFI: 18.9cm (5%ile  83cm, 95%ile  245cm)  BPD:  8.15cm   32w    6d       EDC: .07/02/2012  MATERNAL FINDINGS: Cervix:  Closed Uterus/Adnexae:  4.9  IMPRESSION: As above.  This exam is performed on an emergent basis and does not comprehensively evaluate fetal size, dating, or anatomy, and a follow-up complete  OB US should be considered if further fetal assessment is warranted.   Original Report Authenticated By: Holley Dexter, M.D.     1. Hematemesis   2. Gastroesophageal reflux in pregancy, third trimester   3. Late prenatal care, third trimester      MDM  Abigail Mays is a 27 y.o. [phone] at [redacted]w[redacted]d who presents complaining of bloody vomitus. Discussed the case with on-call GI physician Dr. Evette Cristal, who recommends putting patient on a PPI and antiemetic. He does not feel that she needs to be seen as an outpatient unless the problem persists. Hgb is stable at 9.9. Cervix closed on ultrasound. Wet prep and UA unremarkable. GC/chlamydia sent. Will rx reglan and protonix for pt to take at home.  Will send message to [facility] admin team to get pt set up for a prenatal visit in the clinic here to establish care. Will also order outpatient anatomy scan. Pt is stable for discharge at this time.  Levert Feinstein, MD Family Medicine PGY-1  I saw and examined patient and agree with above resident note. I reviewed history, imaging, labs, and vitals. I personally reviewed the fetal heart tracing, and it is reactive (145/moderate var/accels present/no decels). Napoleon Form, MD

## 2012-05-13 NOTE — MAU Note (Signed)
Patient states she has had no prenatal care pending Medicaid. States she has been having vomiting when she coughs but today had one episode with a lot of vomitus and she saw blood at the end. States she has been having a sharp lower abdominal pain for about 2 weeks. Denies bleeding or leaking and reports good fetal movement.

## 2012-05-13 NOTE — MAU Note (Signed)
Pt also has lump under r armpit that developed two days ago.

## 2012-05-14 LAB — GC/CHLAMYDIA PROBE AMP
CT Probe RNA: NEGATIVE
GC Probe RNA: NEGATIVE

## 2012-05-15 NOTE — MAU Provider Note (Signed)
Chart reviewed and agree with management and plan.  

## 2012-05-23 ENCOUNTER — Ambulatory Visit (HOSPITAL_COMMUNITY)
Admission: RE | Admit: 2012-05-23 | Discharge: 2012-05-23 | Disposition: A | Discharge status: Home / Self Care | Payer: Medicaid Other | Source: Ambulatory Visit | Attending: Family Medicine | Admitting: Family Medicine

## 2012-05-23 DIAGNOSIS — O0933 Supervision of pregnancy with insufficient antenatal care, third trimester: Secondary | ICD-10-CM

## 2012-05-23 DIAGNOSIS — O0930 Supervision of pregnancy with insufficient antenatal care, unspecified trimester: Secondary | ICD-10-CM | POA: Insufficient documentation

## 2012-05-23 DIAGNOSIS — Z3689 Encounter for other specified antenatal screening: Secondary | ICD-10-CM | POA: Insufficient documentation

## 2012-05-30 ENCOUNTER — Other Ambulatory Visit (HOSPITAL_COMMUNITY)
Admission: RE | Admit: 2012-05-30 | Discharge: 2012-05-30 | Disposition: A | Discharge status: Home / Self Care | Payer: Medicaid Other | Source: Ambulatory Visit | Attending: Family Medicine | Admitting: Family Medicine

## 2012-05-30 ENCOUNTER — Ambulatory Visit (INDEPENDENT_AMBULATORY_CARE_PROVIDER_SITE_OTHER): Payer: Medicaid Other | Admitting: Family Medicine

## 2012-05-30 ENCOUNTER — Encounter: Payer: Self-pay | Admitting: Family Medicine

## 2012-05-30 VITALS — BP 98/72 | Temp 99.2°F | Wt 186.2 lb

## 2012-05-30 DIAGNOSIS — O0930 Supervision of pregnancy with insufficient antenatal care, unspecified trimester: Secondary | ICD-10-CM | POA: Insufficient documentation

## 2012-05-30 DIAGNOSIS — Z23 Encounter for immunization: Secondary | ICD-10-CM

## 2012-05-30 DIAGNOSIS — O09893 Supervision of other high risk pregnancies, third trimester: Secondary | ICD-10-CM

## 2012-05-30 DIAGNOSIS — Z01419 Encounter for gynecological examination (general) (routine) without abnormal findings: Secondary | ICD-10-CM | POA: Insufficient documentation

## 2012-05-30 DIAGNOSIS — O0933 Supervision of pregnancy with insufficient antenatal care, third trimester: Secondary | ICD-10-CM

## 2012-05-30 DIAGNOSIS — F329 Major depressive disorder, single episode, unspecified: Secondary | ICD-10-CM | POA: Insufficient documentation

## 2012-05-30 DIAGNOSIS — O09299 Supervision of pregnancy with other poor reproductive or obstetric history, unspecified trimester: Secondary | ICD-10-CM

## 2012-05-30 DIAGNOSIS — O99340 Other mental disorders complicating pregnancy, unspecified trimester: Secondary | ICD-10-CM

## 2012-05-30 LAB — POCT URINALYSIS DIP (DEVICE)
Glucose, UA: NEGATIVE mg/dL
Nitrite: NEGATIVE
Protein, ur: 100 mg/dL — AB
Urobilinogen, UA: 1 mg/dL (ref 0.0–1.0)

## 2012-05-30 LAB — OB RESULTS CONSOLE GBS: GBS: POSITIVE

## 2012-05-30 MED ORDER — TETANUS-DIPHTH-ACELL PERTUSSIS 5-2.5-18.5 LF-MCG/0.5 IM SUSP
0.5000 mL | Freq: Once | INTRAMUSCULAR | Status: AC
  Administered 2012-05-30: 0.5 mL via INTRAMUSCULAR

## 2012-05-30 MED ORDER — SERTRALINE HCL 25 MG PO TABS: 25.0000 mg | ORAL_TABLET | Freq: Every day | ORAL | Status: DC

## 2012-05-30 MED ORDER — INFLUENZA VIRUS VACC SPLIT PF IM SUSP
0.5000 mL | Freq: Once | INTRAMUSCULAR | Status: AC
  Administered 2012-05-30: 0.5 mL via INTRAMUSCULAR

## 2012-05-30 NOTE — Progress Notes (Signed)
Subjective:    Abigail Mays is being seen today for her first obstetrical visit.  This is not a planned pregnancy. She is at [redacted]w[redacted]d gestation based on her LMP (09/26/11) consistent with an 8 week u/s. Her obstetrical history is significant for late prenatal care, anxiety/depression, and marijuana use early in pregnancy.. Relationship with FOB: not involvedPatient is planning to put baby up for adoption. She lives with her mother, who is supportive, and her three other children, ages 55, 65 and 53. Patient does not intend to breast feed. Pregnancy history fully reviewed. She would like a tubal ligation after this delivery.  She is having occasional Braxton-Hicks contractions but no bleeding or leak of fluid. She is feeling the baby move. She does complain of bilateral pelvic pain.She denies nausea, vomiting, diarrhea or constipation. She had gonorrhea earlier in the pregnancy with a negative test of cure. She denies any discharge or dysuria today. She has a history of an abnormal pap smear with reportedly normal colposcopy about 2 years ago.   The patient does complain of depression and anxiety and becomes very tearful talking about this. She endorses depressed mood, tearfulness, and thoughts of death/suicide She has a history of prior suicide attempt by overdose but does not currently have a plan or active notion of carrying out a suicide attempt. Her mother lives with her and she feels she could talk to her if needed. She has never been on psychiatric medications but is willing to try something.   Menstrual History: OB History   Grav Para Term Preterm Abortions TAB SAB Ect Mult Living   4 3 2 1      3       Patient's last menstrual period was 09/26/2011.    I have reviewed dating, OB history, medical and surgical history, family history, social history, allergies, medications, labs and imaging.  Review of Systems Pertinent pos and neg mentioned in HPI.    Objective:   GEN:  WNWD, no  distress HEENT:  NCAT, EOMI, conjunctiva clear NECK:  Supple, non-tender, no thyromegaly, trachea midline CV: RRR, no murmur RESP:  CTAB ABD:  Soft, non-tender, no guarding or rebound, normal bowel sounds, gravid, size appropriate for dates EXTREM:  Warm, well perfused, no edema or tenderness NEURO:  Alert, oriented, no focal deficits GU:  Normal external genitalia, normal vagina and cervix. Watery/foamy white discharge. Some bleeding after pap. No CMT or adnexal tenderness. PSYCH:  Tearful during exam, constricted affect   Assessment:    Pregnancy at [redacted]w[redacted]d    Depression/anxiety History gonorrhea current pregnancy History abnl pap Plan to put baby up for adoption Late prenatal care  Plan:    Initial labs drawn, including 1 hour GTT, pap and GBS. Already has TOC for gonorrhea. Prenatal vitamins. Problem list reviewed and updated. AFP3 discussed: too late. Role of ultrasound in pregnancy discussed; fetal survey: results reviewed - normal. Amniocentesis discussed: not indicated. Zoloft 50 mg daily (start with 25 and increase in 3 days).  Will see SW today Follow up in 1 weeks. 50% of 45 min visit spent on counseling and coordination of care.

## 2012-05-30 NOTE — Patient Instructions (Signed)

## 2012-05-30 NOTE — Progress Notes (Signed)
P=86, Here for Initial ob visit. Not sure of pregravid weight. Trace edema in feet. Pelvic pressure and pain,Given new patient information. Desires flu shot and tdap.

## 2012-05-31 ENCOUNTER — Encounter: Payer: Self-pay | Admitting: Family Medicine

## 2012-05-31 LAB — OBSTETRIC PANEL
Antibody Screen: NEGATIVE
Basophils Absolute: 0 10*3/uL (ref 0.0–0.1)
Eosinophils Absolute: 0 10*3/uL (ref 0.0–0.7)
Eosinophils Relative: 1 % (ref 0–5)
HCT: 31.5 % — ABNORMAL LOW (ref 36.0–46.0)
Lymphocytes Relative: 22 % (ref 12–46)
MCH: 30.5 pg (ref 26.0–34.0)
MCHC: 34.3 g/dL (ref 30.0–36.0)
MCV: 89 fL (ref 78.0–100.0)
Monocytes Absolute: 0.7 10*3/uL (ref 0.1–1.0)
RDW: 13.7 % (ref 11.5–15.5)
Rubella: 1.48 Index — ABNORMAL HIGH (ref <0.90)
WBC: 8.8 10*3/uL (ref 4.0–10.5)

## 2012-05-31 LAB — WET PREP, GENITAL
Clue Cells Wet Prep HPF POC: NONE SEEN
Trich, Wet Prep: NONE SEEN

## 2012-05-31 LAB — HIV ANTIBODY (ROUTINE TESTING W REFLEX): HIV: NONREACTIVE

## 2012-06-01 ENCOUNTER — Other Ambulatory Visit: Payer: Self-pay | Admitting: Family Medicine

## 2012-06-01 ENCOUNTER — Encounter: Payer: Self-pay | Admitting: Family Medicine

## 2012-06-01 DIAGNOSIS — B373 Candidiasis of vulva and vagina: Secondary | ICD-10-CM

## 2012-06-01 MED ORDER — FLUCONAZOLE 150 MG PO TABS: 150.0000 mg | ORAL_TABLET | Freq: Once | ORAL | Status: DC

## 2012-06-03 ENCOUNTER — Encounter: Payer: Self-pay | Admitting: Family Medicine

## 2012-06-04 ENCOUNTER — Encounter: Payer: Self-pay | Admitting: *Deleted

## 2012-06-04 LAB — HEMOGLOBINOPATHY EVALUATION
Hemoglobin Other: 0 %
Hgb S Quant: 0 %

## 2012-06-05 ENCOUNTER — Telehealth: Payer: Self-pay | Admitting: General Practice

## 2012-06-05 LAB — CULTURE, BETA STREP (GROUP B ONLY)

## 2012-06-05 NOTE — Telephone Encounter (Signed)
Called patient and informed her of results and medicine available for pickup. Patient verbalized understanding and had no further questions

## 2012-06-05 NOTE — Telephone Encounter (Signed)
Message copied by Kathee Delton on Wed Jun 05, 2012 11:40 AM ------      Message from: FERRY, [place]      Created: Sat Jun 01, 2012 10:22 AM       Needs treatment for yeast. Rx sent for diflucan- 1 now and 1 refill can be taken in 3 days if not resolved. ------

## 2012-06-06 ENCOUNTER — Encounter: Payer: Self-pay | Admitting: Family Medicine

## 2012-06-06 ENCOUNTER — Encounter: Payer: Medicaid Other | Admitting: Obstetrics & Gynecology

## 2012-06-13 ENCOUNTER — Ambulatory Visit (INDEPENDENT_AMBULATORY_CARE_PROVIDER_SITE_OTHER): Payer: Medicaid Other | Admitting: Obstetrics & Gynecology

## 2012-06-13 VITALS — BP 117/78 | Temp 97.6°F | Wt 194.1 lb

## 2012-06-13 DIAGNOSIS — O99340 Other mental disorders complicating pregnancy, unspecified trimester: Secondary | ICD-10-CM

## 2012-06-13 DIAGNOSIS — F329 Major depressive disorder, single episode, unspecified: Secondary | ICD-10-CM

## 2012-06-13 LAB — POCT URINALYSIS DIP (DEVICE)
Bilirubin Urine: NEGATIVE
Nitrite: NEGATIVE
Protein, ur: NEGATIVE mg/dL
pH: 7 (ref 5.0–8.0)

## 2012-06-13 NOTE — Progress Notes (Signed)
Pulse- 94  Edema-feet  Pain/pressure- lower abd

## 2012-06-13 NOTE — Progress Notes (Signed)
Signed Medicaid papers. No problems

## 2012-06-13 NOTE — Patient Instructions (Signed)
Vaginal Delivery  Your caregiver must first be sure you are in labor. Signs of labor include:   You may pass what is called "the mucus plug" before labor begins. This is a small amount of blood stained mucus.   Regular uterine contractions.   The time between contractions get closer together.   The discomfort and pain gradually gets more intense.   Pains are mostly located in the back.   Pains get worse when walking.   The cervix (the opening of the uterus) becomes thinner (begins to efface) and opens up (dilates).  Once you are in labor and admitted into the hospital or care center, your caregiver will do the following:   A complete physical examination.   Check your vital signs (blood pressure, pulse, temperature and the fetal heart rate).   Do a vaginal examination (using a sterile glove and lubricant) to determine:   The position (presentation) of the baby (head [vertex] or buttock first).   The level (station) of the baby's head in the birth canal.   The effacement and dilatation of the cervix.   You may have your pubic hair shaved and be given an enema depending on your caregiver and the circumstance.   An electronic monitor is usually placed on your abdomen. The monitor follows the length and intensity of the contractions, as well as the baby's heart rate.   Usually, your caregiver will insert an IV in your arm with a bottle of sugar water. This is done as a precaution so that medications can be given to you quickly during labor or delivery.  NORMAL LABOR AND DELIVERY IS DIVIDED UP INTO 3 STAGES:  First Stage  This is when regular contractions begin and the cervix begins to efface and dilate. This stage can last from 3 to 15 hours. The end of the first stage is when the cervix is 100% effaced and 10 centimeters dilated. Pain medications may be given by    Injection (morphine, demerol, etc.)    Regional anesthesia (spinal, caudal or epidural, anesthetics given in different locations of the spine). Paracervical pain medication may be given, which is an injection of and anesthetic on each side of the cervix.  A pregnant woman may request to have "Natural Childbirth" which is not to have any medications or anesthesia during her labor and delivery.  Second Stage  This is when the baby comes down through the birth canal (vagina) and is born. This can take 1 to 4 hours. As the baby's head comes down through the birth canal, you may feel like you are going to have a bowel movement. You will get the urge to bear down and push until the baby is delivered. As the baby's head is being delivered, the caregiver will decide if an episiotomy (a cut in the perineum and vagina area) is needed to prevent tearing of the tissue in this area. The episiotomy is sewn up after the delivery of the baby and placenta. Sometimes a mask with nitrous oxide is given for the mother to breath during the delivery of the baby to help if there is too much pain. The end of Stage 2 is when the baby is fully delivered. Then when the umbilical cord stops pulsating it is clamped and cut.  Third Stage  The third stage begins after the baby is completely delivered and ends after the placenta (afterbirth) is delivered. This usually takes 5 to 30 minutes. After the placenta is delivered, a medication is given   either by intravenous or injection to help contract the uterus and prevent bleeding. The third stage is not painful and pain medication is usually not necessary. If an episiotomy was done, it is repaired at this time.  After the delivery, the mother is watched and monitored closely for 1 to 2 hours to make sure there is no postpartum bleeding (hemorrhage). If there is a lot of bleeding, medication is given to contract the uterus and stop the bleeding.  Document Released: 10/05/2007 Document Revised: 09/20/2011 Document Reviewed: 10/05/2007   [employer] Patient Information 2014 [employer].

## 2012-06-20 ENCOUNTER — Encounter: Payer: Medicaid Other | Admitting: Obstetrics and Gynecology

## 2012-06-25 ENCOUNTER — Encounter (HOSPITAL_COMMUNITY): Payer: Self-pay | Admitting: Anesthesiology

## 2012-06-25 ENCOUNTER — Encounter (HOSPITAL_COMMUNITY)
Admission: AD | Disposition: A | Discharge status: Home / Self Care | Payer: Self-pay | Source: Ambulatory Visit | Attending: Family Medicine

## 2012-06-25 ENCOUNTER — Encounter (HOSPITAL_COMMUNITY): Payer: Self-pay | Admitting: *Deleted

## 2012-06-25 ENCOUNTER — Inpatient Hospital Stay (HOSPITAL_COMMUNITY): Payer: Medicaid Other | Admitting: Anesthesiology

## 2012-06-25 ENCOUNTER — Inpatient Hospital Stay (HOSPITAL_COMMUNITY)
Admission: AD | Admit: 2012-06-25 | Discharge: 2012-06-27 | DRG: 767 | Disposition: A | Discharge status: Home / Self Care | Payer: Medicaid Other | Source: Ambulatory Visit | Attending: Family Medicine | Admitting: Family Medicine

## 2012-06-25 DIAGNOSIS — O0933 Supervision of pregnancy with insufficient antenatal care, third trimester: Secondary | ICD-10-CM

## 2012-06-25 DIAGNOSIS — O0940 Supervision of pregnancy with grand multiparity, unspecified trimester: Secondary | ICD-10-CM

## 2012-06-25 DIAGNOSIS — Z302 Encounter for sterilization: Secondary | ICD-10-CM

## 2012-06-25 HISTORY — PX: TUBAL LIGATION: SHX77

## 2012-06-25 LAB — CBC
MCH: 29.6 pg (ref 26.0–34.0)
MCV: 89.1 fL (ref 78.0–100.0)
Platelets: 254 10*3/uL (ref 150–400)
RDW: 13.3 % (ref 11.5–15.5)

## 2012-06-25 LAB — RPR: RPR Ser Ql: NONREACTIVE

## 2012-06-25 SURGERY — LIGATION, FALLOPIAN TUBE, POSTPARTUM
Anesthesia: Epidural | Site: Abdomen | Laterality: Bilateral | Wound class: Clean

## 2012-06-25 MED ORDER — LIDOCAINE HCL (PF) 1 % IJ SOLN
30.0000 mL | INTRAMUSCULAR | Status: DC | PRN
  Filled 2012-06-25: qty 30

## 2012-06-25 MED ORDER — IBUPROFEN 600 MG PO TABS: 600.0000 mg | ORAL_TABLET | Freq: Four times a day (QID) | ORAL | Status: DC | PRN

## 2012-06-25 MED ORDER — FENTANYL CITRATE 0.05 MG/ML IJ SOLN
INTRAMUSCULAR | Status: AC
  Filled 2012-06-25: qty 2

## 2012-06-25 MED ORDER — LACTATED RINGERS IV SOLN
500.0000 mL | Freq: Once | INTRAVENOUS | Status: AC
  Administered 2012-06-25: 500 mL via INTRAVENOUS

## 2012-06-25 MED ORDER — SIMETHICONE 80 MG PO CHEW: 80.0000 mg | CHEWABLE_TABLET | ORAL | Status: DC | PRN

## 2012-06-25 MED ORDER — DIPHENHYDRAMINE HCL 25 MG PO CAPS: 25.0000 mg | ORAL_CAPSULE | Freq: Four times a day (QID) | ORAL | Status: DC | PRN

## 2012-06-25 MED ORDER — LANOLIN HYDROUS EX OINT: TOPICAL_OINTMENT | CUTANEOUS | Status: DC | PRN

## 2012-06-25 MED ORDER — OXYCODONE-ACETAMINOPHEN 5-325 MG PO TABS
1.0000 | ORAL_TABLET | ORAL | Status: DC | PRN
  Administered 2012-06-25 – 2012-06-26 (×3): 2 via ORAL
  Administered 2012-06-26 – 2012-06-27 (×6): 1 via ORAL
  Filled 2012-06-25: qty 2
  Filled 2012-06-25 (×4): qty 1
  Filled 2012-06-25: qty 2
  Filled 2012-06-25 (×4): qty 1
  Filled 2012-06-25: qty 2

## 2012-06-25 MED ORDER — ZOLPIDEM TARTRATE 5 MG PO TABS: 5.0000 mg | ORAL_TABLET | Freq: Every evening | ORAL | Status: DC | PRN

## 2012-06-25 MED ORDER — ZOLPIDEM TARTRATE 5 MG PO TABS
5.0000 mg | ORAL_TABLET | Freq: Every evening | ORAL | Status: DC | PRN
  Administered 2012-06-26: 5 mg via ORAL
  Filled 2012-06-25: qty 1

## 2012-06-25 MED ORDER — LACTATED RINGERS IV SOLN: 500.0000 mL | INTRAVENOUS | Status: DC | PRN

## 2012-06-25 MED ORDER — PHENYLEPHRINE 40 MCG/ML (10ML) SYRINGE FOR IV PUSH (FOR BLOOD PRESSURE SUPPORT)
80.0000 ug | PREFILLED_SYRINGE | INTRAVENOUS | Status: DC | PRN
  Filled 2012-06-25: qty 5

## 2012-06-25 MED ORDER — LIDOCAINE HCL (PF) 1 % IJ SOLN
INTRAMUSCULAR | Status: DC | PRN
  Administered 2012-06-25 (×2): 4 mL

## 2012-06-25 MED ORDER — TETANUS-DIPHTH-ACELL PERTUSSIS 5-2.5-18.5 LF-MCG/0.5 IM SUSP: 0.5000 mL | Freq: Once | INTRAMUSCULAR | Status: DC

## 2012-06-25 MED ORDER — PHENYLEPHRINE HCL 10 MG/ML IJ SOLN
INTRAMUSCULAR | Status: DC | PRN
  Administered 2012-06-25: 40 ug via INTRAVENOUS

## 2012-06-25 MED ORDER — SODIUM BICARBONATE 8.4 % IV SOLN
INTRAVENOUS | Status: DC | PRN
  Administered 2012-06-25: 5 mL via EPIDURAL

## 2012-06-25 MED ORDER — ONDANSETRON HCL 4 MG/2ML IJ SOLN
INTRAMUSCULAR | Status: DC | PRN
  Administered 2012-06-25: 4 mg via INTRAVENOUS

## 2012-06-25 MED ORDER — SERTRALINE HCL 25 MG PO TABS: 25.0000 mg | ORAL_TABLET | Freq: Every day | ORAL | Status: DC

## 2012-06-25 MED ORDER — BUPIVACAINE HCL (PF) 0.25 % IJ SOLN
INTRAMUSCULAR | Status: AC
  Filled 2012-06-25: qty 30

## 2012-06-25 MED ORDER — LIDOCAINE-EPINEPHRINE (PF) 2 %-1:200000 IJ SOLN
INTRAMUSCULAR | Status: AC
  Filled 2012-06-25: qty 20

## 2012-06-25 MED ORDER — SODIUM CHLORIDE 0.9 % IV SOLN
1.0000 g | INTRAVENOUS | Status: DC
  Filled 2012-06-25 (×4): qty 1000

## 2012-06-25 MED ORDER — SODIUM CHLORIDE 0.9 % IV SOLN: 1.0000 g | INTRAVENOUS | Status: DC

## 2012-06-25 MED ORDER — OXYCODONE-ACETAMINOPHEN 5-325 MG PO TABS: 1.0000 | ORAL_TABLET | ORAL | Status: DC | PRN

## 2012-06-25 MED ORDER — PHENYLEPHRINE 40 MCG/ML (10ML) SYRINGE FOR IV PUSH (FOR BLOOD PRESSURE SUPPORT): 80.0000 ug | PREFILLED_SYRINGE | INTRAVENOUS | Status: DC | PRN

## 2012-06-25 MED ORDER — IBUPROFEN 600 MG PO TABS: 600.0000 mg | ORAL_TABLET | Freq: Four times a day (QID) | ORAL | Status: DC

## 2012-06-25 MED ORDER — SERTRALINE HCL 50 MG PO TABS
50.0000 mg | ORAL_TABLET | Freq: Every day | ORAL | Status: DC
  Administered 2012-06-25 – 2012-06-27 (×4): 50 mg via ORAL
  Filled 2012-06-25 (×5): qty 1

## 2012-06-25 MED ORDER — TETANUS-DIPHTH-ACELL PERTUSSIS 5-2.5-18.5 LF-MCG/0.5 IM SUSP
0.5000 mL | Freq: Once | INTRAMUSCULAR | Status: AC
  Administered 2012-06-26: 0.5 mL via INTRAMUSCULAR
  Filled 2012-06-25: qty 0.5

## 2012-06-25 MED ORDER — FENTANYL CITRATE 0.05 MG/ML IJ SOLN
INTRAMUSCULAR | Status: AC
  Administered 2012-06-25: 50 ug via INTRAVENOUS
  Filled 2012-06-25: qty 2

## 2012-06-25 MED ORDER — MEASLES, MUMPS & RUBELLA VAC ~~LOC~~ INJ: 0.5000 mL | INJECTION | Freq: Once | SUBCUTANEOUS | Status: DC

## 2012-06-25 MED ORDER — LACTATED RINGERS IV SOLN
INTRAVENOUS | Status: DC | PRN
  Administered 2012-06-25: 16:00:00 via INTRAVENOUS

## 2012-06-25 MED ORDER — DIBUCAINE 1 % RE OINT: 1.0000 "application " | TOPICAL_OINTMENT | RECTAL | Status: DC | PRN

## 2012-06-25 MED ORDER — OXYTOCIN BOLUS FROM INFUSION
500.0000 mL | INTRAVENOUS | Status: DC
  Administered 2012-06-25: 500 mL via INTRAVENOUS

## 2012-06-25 MED ORDER — ONDANSETRON HCL 4 MG/2ML IJ SOLN: 4.0000 mg | Freq: Four times a day (QID) | INTRAMUSCULAR | Status: DC | PRN

## 2012-06-25 MED ORDER — SENNOSIDES-DOCUSATE SODIUM 8.6-50 MG PO TABS: 2.0000 | ORAL_TABLET | Freq: Every day | ORAL | Status: DC

## 2012-06-25 MED ORDER — ACETAMINOPHEN 325 MG PO TABS: 650.0000 mg | ORAL_TABLET | ORAL | Status: DC | PRN

## 2012-06-25 MED ORDER — LIDOCAINE HCL (CARDIAC) 10 MG/ML IV SOLN
INTRAVENOUS | Status: DC | PRN
  Administered 2012-06-25: 20 mg via INTRAVENOUS

## 2012-06-25 MED ORDER — BENZOCAINE-MENTHOL 20-0.5 % EX AERO: 1.0000 "application " | INHALATION_SPRAY | CUTANEOUS | Status: DC | PRN

## 2012-06-25 MED ORDER — MIDAZOLAM HCL 2 MG/2ML IJ SOLN
INTRAMUSCULAR | Status: AC
  Filled 2012-06-25: qty 2

## 2012-06-25 MED ORDER — WITCH HAZEL-GLYCERIN EX PADS: 1.0000 "application " | MEDICATED_PAD | CUTANEOUS | Status: DC | PRN

## 2012-06-25 MED ORDER — SODIUM BICARBONATE 8.4 % IV SOLN
INTRAVENOUS | Status: AC
  Filled 2012-06-25: qty 50

## 2012-06-25 MED ORDER — DEXAMETHASONE SODIUM PHOSPHATE 10 MG/ML IJ SOLN
INTRAMUSCULAR | Status: AC
  Filled 2012-06-25: qty 1

## 2012-06-25 MED ORDER — SODIUM CHLORIDE 0.9 % IV SOLN
2.0000 g | Freq: Once | INTRAVENOUS | Status: AC
  Administered 2012-06-25: 2 g via INTRAVENOUS
  Filled 2012-06-25: qty 2000

## 2012-06-25 MED ORDER — ONDANSETRON HCL 4 MG/2ML IJ SOLN
INTRAMUSCULAR | Status: AC
  Filled 2012-06-25: qty 2

## 2012-06-25 MED ORDER — OXYTOCIN 40 UNITS IN LACTATED RINGERS INFUSION - SIMPLE MED
62.5000 mL/h | INTRAVENOUS | Status: DC
  Filled 2012-06-25: qty 1000

## 2012-06-25 MED ORDER — FENTANYL CITRATE 0.05 MG/ML IJ SOLN
INTRAMUSCULAR | Status: DC | PRN
  Administered 2012-06-25: 25 ug via INTRAVENOUS
  Administered 2012-06-25: 50 ug via INTRAVENOUS
  Administered 2012-06-25: 25 ug via INTRAVENOUS

## 2012-06-25 MED ORDER — MIDAZOLAM HCL 5 MG/5ML IJ SOLN
INTRAMUSCULAR | Status: DC | PRN
  Administered 2012-06-25 (×2): 1 mg via INTRAVENOUS

## 2012-06-25 MED ORDER — CITRIC ACID-SODIUM CITRATE 334-500 MG/5ML PO SOLN
30.0000 mL | ORAL | Status: DC | PRN
  Administered 2012-06-25: 30 mL via ORAL
  Filled 2012-06-25: qty 15

## 2012-06-25 MED ORDER — EPHEDRINE 5 MG/ML INJ: 10.0000 mg | INTRAVENOUS | Status: DC | PRN

## 2012-06-25 MED ORDER — BUPIVACAINE HCL (PF) 0.25 % IJ SOLN
INTRAMUSCULAR | Status: DC | PRN
  Administered 2012-06-25: 4.5 mL

## 2012-06-25 MED ORDER — ONDANSETRON HCL 4 MG/2ML IJ SOLN: 4.0000 mg | INTRAMUSCULAR | Status: DC | PRN

## 2012-06-25 MED ORDER — NALBUPHINE SYRINGE 5 MG/0.5 ML: 5.0000 mg | INJECTION | INTRAMUSCULAR | Status: DC | PRN

## 2012-06-25 MED ORDER — FENTANYL 2.5 MCG/ML BUPIVACAINE 1/10 % EPIDURAL INFUSION (WH - ANES)
14.0000 mL/h | INTRAMUSCULAR | Status: DC | PRN
  Filled 2012-06-25: qty 125

## 2012-06-25 MED ORDER — FENTANYL 2.5 MCG/ML BUPIVACAINE 1/10 % EPIDURAL INFUSION (WH - ANES)
INTRAMUSCULAR | Status: DC | PRN
  Administered 2012-06-25: 14 mL/h via EPIDURAL

## 2012-06-25 MED ORDER — FENTANYL CITRATE 0.05 MG/ML IJ SOLN
50.0000 ug | INTRAMUSCULAR | Status: DC | PRN
  Administered 2012-06-25: 50 ug via INTRAVENOUS

## 2012-06-25 MED ORDER — LACTATED RINGERS IV SOLN
INTRAVENOUS | Status: DC
  Administered 2012-06-25: 13:00:00 via INTRAVENOUS

## 2012-06-25 MED ORDER — IBUPROFEN 600 MG PO TABS
600.0000 mg | ORAL_TABLET | Freq: Four times a day (QID) | ORAL | Status: DC
  Administered 2012-06-25 – 2012-06-27 (×9): 600 mg via ORAL
  Filled 2012-06-25 (×9): qty 1

## 2012-06-25 MED ORDER — PRENATAL MULTIVITAMIN CH
1.0000 | ORAL_TABLET | Freq: Every day | ORAL | Status: DC
  Administered 2012-06-26 – 2012-06-27 (×2): 1 via ORAL
  Filled 2012-06-25 (×2): qty 1

## 2012-06-25 MED ORDER — PRENATAL MULTIVITAMIN CH: 1.0000 | ORAL_TABLET | Freq: Every day | ORAL | Status: DC

## 2012-06-25 MED ORDER — ONDANSETRON HCL 4 MG PO TABS: 4.0000 mg | ORAL_TABLET | ORAL | Status: DC | PRN

## 2012-06-25 MED ORDER — EPHEDRINE 5 MG/ML INJ
10.0000 mg | INTRAVENOUS | Status: DC | PRN
  Filled 2012-06-25: qty 4

## 2012-06-25 MED ORDER — DIPHENHYDRAMINE HCL 50 MG/ML IJ SOLN: 12.5000 mg | INTRAMUSCULAR | Status: DC | PRN

## 2012-06-25 MED ORDER — SENNOSIDES-DOCUSATE SODIUM 8.6-50 MG PO TABS
2.0000 | ORAL_TABLET | Freq: Every day | ORAL | Status: DC
  Administered 2012-06-25 – 2012-06-26 (×2): 2 via ORAL

## 2012-06-25 MED ORDER — 0.9 % SODIUM CHLORIDE (POUR BTL) OPTIME
TOPICAL | Status: DC | PRN
  Administered 2012-06-25: 1000 mL

## 2012-06-25 MED ORDER — PROPOFOL INFUSION 10 MG/ML OPTIME
INTRAVENOUS | Status: DC | PRN
  Administered 2012-06-25: 25 ug/kg/min via INTRAVENOUS

## 2012-06-25 SURGICAL SUPPLY — 20 items
CHLORAPREP W/TINT 26ML (MISCELLANEOUS) ×2 IMPLANT
CLIP FILSHIE TUBAL LIGA STRL (Clip) ×3 IMPLANT
CLOTH BEACON ORANGE TIMEOUT ST (SAFETY) ×2 IMPLANT
DRSG COVADERM PLUS 2X2 (GAUZE/BANDAGES/DRESSINGS) ×1 IMPLANT
GLOVE BIOGEL PI IND STRL 7.0 (GLOVE) ×1 IMPLANT
GLOVE BIOGEL PI INDICATOR 7.0 (GLOVE) ×3
GLOVE ECLIPSE 7.0 STRL STRAW (GLOVE) ×4 IMPLANT
GOWN PREVENTION PLUS LG XLONG (DISPOSABLE) ×2 IMPLANT
GOWN PREVENTION PLUS XLARGE (GOWN DISPOSABLE) ×2 IMPLANT
NEEDLE HYPO 22GX1.5 SAFETY (NEEDLE) ×2 IMPLANT
NS IRRIG 1000ML POUR BTL (IV SOLUTION) ×2 IMPLANT
PACK ABDOMINAL MINOR (CUSTOM PROCEDURE TRAY) ×2 IMPLANT
SPONGE LAP 4X18 X RAY DECT (DISPOSABLE) IMPLANT
SUT VIC AB 0 CT1 27 (SUTURE) ×2
SUT VIC AB 0 CT1 27XBRD ANBCTR (SUTURE) ×1 IMPLANT
SUT VICRYL 4-0 PS2 18IN ABS (SUTURE) ×2 IMPLANT
SYR CONTROL 10ML LL (SYRINGE) ×2 IMPLANT
TOWEL OR 17X24 6PK STRL BLUE (TOWEL DISPOSABLE) ×4 IMPLANT
TRAY FOLEY CATH 14FR (SET/KITS/TRAYS/PACK) ×2 IMPLANT
WATER STERILE IRR 1000ML POUR (IV SOLUTION) ×1 IMPLANT

## 2012-06-25 NOTE — H&P (Signed)
Chart reviewed and agree with management and plan.  

## 2012-06-25 NOTE — MAU Note (Signed)
Patient is in tears and very uncomfortable. She was brought in straight from the lobby via wheelchair. She states that the ctx are in intense, she can't sit. No bloody show. Dr Adriana Simas notified.

## 2012-06-25 NOTE — Anesthesia Preprocedure Evaluation (Signed)
Anesthesia Evaluation  Patient identified by MRN, date of birth, ID band Patient awake    Reviewed: Allergy & Precautions, H&P , Patient's Chart, lab work & pertinent test results  Airway Mallampati: II TM Distance: >3 FB Neck ROM: full    Dental no notable dental hx. (+) Teeth Intact   Pulmonary neg pulmonary ROS,  breath sounds clear to auscultation  Pulmonary exam normal       Cardiovascular negative cardio ROS  Rhythm:regular Rate:Normal     Neuro/Psych PSYCHIATRIC DISORDERS Anxiety Depression negative neurological ROS     GI/Hepatic Neg liver ROS, GERD-  Medicated and Controlled,  Endo/Other  negative endocrine ROS  Renal/GU negative Renal ROS  negative genitourinary   Musculoskeletal negative musculoskeletal ROS (+)   Abdominal Normal abdominal exam  (+)   Peds  Hematology negative hematology ROS (+)   Anesthesia Other Findings   Reproductive/Obstetrics Desires permanent sterilization                           Anesthesia Physical  Anesthesia Plan  ASA: II  Anesthesia Plan: Epidural   Post-op Pain Management:    Induction:   Airway Management Planned:   Additional Equipment:   Intra-op Plan:   Post-operative Plan:   Informed Consent: I have reviewed the patients History and Physical, chart, labs and discussed the procedure including the risks, benefits and alternatives for the proposed anesthesia with the patient or authorized representative who has indicated his/her understanding and acceptance.     Plan Discussed with: Anesthesiologist  Anesthesia Plan Comments:         Anesthesia Quick Evaluation

## 2012-06-25 NOTE — Op Note (Signed)
Abigail Mays  06/25/2012  PREOPERATIVE DIAGNOSIS:  Multiparity, undesired fertility  POSTOPERATIVE DIAGNOSIS:  Multiparity, undesired fertility  PROCEDURE:  Postpartum Bilateral Tubal Sterilization using Filshie Clips   ANESTHESIA:  Epidural and local analgesia using 0.25% Marcaine  COMPLICATIONS:  None immediate.  ESTIMATED BLOOD LOSS: 5 ml.  INDICATIONS: 27 y.o. [id number]  with undesired fertility,status post vaginal delivery, desires permanent sterilization.  Other reversible forms of contraception were discussed with patient; she declines all other modalities. Risks of procedure discussed with patient including but not limited to: risk of regret, permanence of method, bleeding, infection, injury to surrounding organs and need for additional procedures.  Failure risk of 0.5-1% with increased risk of ectopic gestation if pregnancy occurs was also discussed with patient.     FINDINGS:  Normal uterus, tubes, and ovaries.  PROCEDURE DETAILS: The patient was taken to the operating room where her spinal anesthesia was dosed up to surgical level and found to be adequate.  She was then placed in a supine position and prepped and draped in the usual sterile fashion.  After an adequate timeout was performed, attention was turned to the patient's abdomen where a small transverse skin incision was made under the umbilical fold. The incision was taken down to the layer of fascia using the scalpel, and fascia was incised, and extended bilaterally. The peritoneum was entered in a sharp fashion. The patient was placed in Trendelenburg.  A moist lap pad was used to move omentum and bowel away until the left fallopian tube was identified and grasped with a Babcock clamp, and followed out to the fimbriated end.  A Filshie clip was placed on the left fallopian tube about 2 cm from the cornu.  A similar process was carried out on the right side allowing for bilateral tubal sterilization.  Good hemostasis was  noted overall.  The instruments were then removed from the patient's abdomen and the fascial incision was repaired with 0 Vicryl, and the skin was closed with a 4-0 Vicryl subcuticular stitch. The patient tolerated the procedure well.  Sponge, lap, and needle counts were correct times two.  The patient was then taken to the recovery room awake, extubated and in stable condition.  PRATT,TANYA S MD 06/25/2012 5:08 PM

## 2012-06-25 NOTE — Transfer of Care (Signed)
Immediate Anesthesia Transfer of Care Note  Patient: Abigail Mays  Procedure(s) Performed: Procedure(s) with comments: POST PARTUM TUBAL LIGATION (Bilateral) - with filshie clips  Patient Location: PACU  Anesthesia Type:Epidural  Level of Consciousness: awake, alert , oriented and patient cooperative  Airway & Oxygen Therapy: Patient Spontanous Breathing  Post-op Assessment: Report given to PACU RN and Post -op Vital signs reviewed and stable  Post vital signs: Reviewed and stable  Complications: No apparent anesthesia complications

## 2012-06-25 NOTE — H&P (Signed)
Abigail Mays is a 27 y.o. [phone] at [redacted]w[redacted]d who presents with contractions.  Patient arrived and brought straight back to MAU with reports of severe pain secondary to contractions.  Patient reports contractions started this morning at 9 am.  Contractions are intense and severe, occuring ~every 7 mins.  No loss of fluid or vaginal bleeding.   In the MAU, patient found to be 5.5 cm dilated by nursing staff and was sent directly to L&D.  *Patient receives [facility].  GBS negative, [place] normal, 1 hour GTT = 111.  Desires BTL postpartum.  ROS: Patient denies SOB, chest pain, headache, RUQ pain.   History OB History   Grav Para Term Preterm Abortions TAB SAB Ect Mult Living   4 3 2 1      3      Past Medical History  Diagnosis Date  . Abnormal Pap smear   . Anxiety   . Depression     No meds  . Chlamydia    Past Surgical History  Procedure Laterality Date  . No past surgeries    . Wisdom tooth extraction     Family History: family history is not on file. Social History:  reports that she has never smoked. She has never used smokeless tobacco. She reports that she uses illicit drugs (Marijuana). She reports that she does not drink alcohol.   Prenatal Transfer Tool  Maternal Diabetes: No; 1 hour GTT = 111 Genetic Screening: Too late Maternal Ultrasounds/Referrals: Normal Fetal Ultrasounds or other Referrals:  None Maternal Substance Abuse:  No Significant Maternal Medications:  Zoloft, Reglan, Protonix Significant Maternal Lab Results:  Lab values include: Group B Strep positive  ROS Per HPI  Exam Physical Exam  Gen: appears in moderate distress secondary to pain from contractions. Abd: gravid but otherwise soft, nontender to palpation Ext: no appreciable lower extremity edema bilaterally Neuro: no focal deficits. GU: normal appearing external genitalia Cervical Exam: Dilation: 7 Effacement (%): 80 Cervical Position: Middle Station: -2 Presentation: Vertex Exam  by:: Dr. Adriana Mays  FHR: baseline 130, mod variability, 15x15 accels, no decels Toco: Every 1-1.5 min  Prenatal labs: ABO, Rh: B/POS/-- (05/22 0949) Antibody: NEG (05/22 0949) Rubella: 1.48 (05/22 0949) RPR: NON REAC (05/22 0949)  HBsAg: NEGATIVE (05/22 0949)  HIV: NON REACTIVE (05/22 0949)  GBS:  Positive  Assessment/Plan: Abigail Mays is a 27 y.o. [phone] at [redacted]w[redacted]d who presents in active labor. - Admit to L&D - Pain control: IV Nubain; Epidural if patient desires - Anticipate NSVD  Abigail Mays Other 06/25/2012, 1:06 PM  Evaluation and management procedures were performed by Resident physician under my supervision/collaboration. Chart reviewed, patient examined by me and I agree with management and plan. Abigail Mays, CNM 06/25/2012 4:32 PM

## 2012-06-25 NOTE — Anesthesia Preprocedure Evaluation (Signed)
Anesthesia Evaluation  Patient identified by MRN, date of birth, ID band Patient awake    Reviewed: Allergy & Precautions, H&P , Patient's Chart, lab work & pertinent test results  Airway Mallampati: II TM Distance: >3 FB Neck ROM: full    Dental no notable dental hx. (+) Teeth Intact   Pulmonary neg pulmonary ROS,  breath sounds clear to auscultation  Pulmonary exam normal       Cardiovascular negative cardio ROS  Rhythm:regular Rate:Normal     Neuro/Psych PSYCHIATRIC DISORDERS Anxiety Depression negative neurological ROS     GI/Hepatic Neg liver ROS, GERD-  Medicated and Controlled,  Endo/Other  negative endocrine ROS  Renal/GU negative Renal ROS  negative genitourinary   Musculoskeletal   Abdominal Normal abdominal exam  (+)   Peds  Hematology negative hematology ROS (+)   Anesthesia Other Findings   Reproductive/Obstetrics (+) Pregnancy                           Anesthesia Physical Anesthesia Plan  ASA: II  Anesthesia Plan: Epidural   Post-op Pain Management:    Induction:   Airway Management Planned:   Additional Equipment:   Intra-op Plan:   Post-operative Plan:   Informed Consent: I have reviewed the patients History and Physical, chart, labs and discussed the procedure including the risks, benefits and alternatives for the proposed anesthesia with the patient or authorized representative who has indicated his/her understanding and acceptance.     Plan Discussed with: Anesthesiologist  Anesthesia Plan Comments:         Anesthesia Quick Evaluation

## 2012-06-25 NOTE — Anesthesia Postprocedure Evaluation (Signed)
  Anesthesia Post-op Note  Patient: Irisa S Caseres  Procedure(s) Performed: Procedure(s) with comments: POST PARTUM TUBAL LIGATION (Bilateral) - with filshie clips  Patient Location: PACU  Anesthesia Type:Epidural  Level of Consciousness: awake, alert  and oriented  Airway and Oxygen Therapy: Patient Spontanous Breathing  Post-op Pain: none  Post-op Assessment: Post-op Vital signs reviewed, Patient's Cardiovascular Status Stable, Respiratory Function Stable, Patent Airway, No signs of Nausea or vomiting, Pain level controlled, No headache and No backache  Post-op Vital Signs: Reviewed and stable  Complications: No apparent anesthesia complications

## 2012-06-25 NOTE — Anesthesia Procedure Notes (Signed)
Epidural Patient location during procedure: OB Start time: 06/25/2012 2:09 PM  Staffing Anesthesiologist: Allye Hoyos A. Performed by: anesthesiologist   Preanesthetic Checklist Completed: patient identified, site marked, surgical consent, pre-op evaluation, timeout performed, IV checked, risks and benefits discussed and monitors and equipment checked  Epidural Patient position: sitting Prep: site prepped and draped and DuraPrep Patient monitoring: continuous pulse ox and blood pressure Approach: midline Injection technique: LOR air  Needle:  Needle type: Tuohy  Needle gauge: 17 G Needle length: 9 cm and 9 Needle insertion depth: 6 cm Catheter type: closed end flexible Catheter size: 19 Gauge Catheter at skin depth: 11 cm Test dose: negative and Other  Assessment Events: blood not aspirated, injection not painful, no injection resistance, negative IV test and no paresthesia  Additional Notes Patient identified. Risks and benefits discussed including failed block, incomplete  Pain control, post dural puncture headache, nerve damage, paralysis, blood pressure Changes, nausea, vomiting, reactions to medications-both toxic and allergic and post Partum back pain. All questions were answered. Patient expressed understanding and wished to proceed. Sterile technique was used throughout procedure. Epidural site was Dressed with sterile barrier dressing. No paresthesias, signs of intravascular injection Or signs of intrathecal spread were encountered.  Patient was more comfortable after the epidural was dosed. Please see RN's note for documentation of vital signs and FHR which are stable.

## 2012-06-26 ENCOUNTER — Encounter (HOSPITAL_COMMUNITY): Payer: Self-pay | Admitting: Family Medicine

## 2012-06-26 LAB — CBC
MCV: 89.6 fL (ref 78.0–100.0)
Platelets: 230 10*3/uL (ref 150–400)
RBC: 3.47 MIL/uL — ABNORMAL LOW (ref 3.87–5.11)
WBC: 10.7 10*3/uL — ABNORMAL HIGH (ref 4.0–10.5)

## 2012-06-26 MED ORDER — OXYCODONE-ACETAMINOPHEN 5-325 MG PO TABS: 1.0000 | ORAL_TABLET | ORAL | Status: DC | PRN

## 2012-06-26 MED ORDER — IBUPROFEN 600 MG PO TABS: 600.0000 mg | ORAL_TABLET | Freq: Four times a day (QID) | ORAL | Status: DC

## 2012-06-26 NOTE — Anesthesia Postprocedure Evaluation (Signed)
  Anesthesia Post-op Note  Patient: Abigail Mays  Procedure(s) Performed: * No procedures listed *  Patient Location: Women's Unit  Anesthesia Type:Epidural  Level of Consciousness: awake  Airway and Oxygen Therapy: Patient Spontanous Breathing  Post-op Pain: none  Post-op Assessment: Patient's Cardiovascular Status Stable, Respiratory Function Stable, Patent Airway, No signs of Nausea or vomiting, Adequate PO intake, Pain level controlled, No headache, No backache, No residual numbness and No residual motor weakness  Post-op Vital Signs: Reviewed and stable  Complications: No apparent anesthesia complications

## 2012-06-26 NOTE — Progress Notes (Signed)
Ur chart review completed.  

## 2012-06-26 NOTE — Progress Notes (Signed)
06/26/12 1500  Clinical Encounter Type  Visited With Patient  Visit Type Follow-up   Pt busy with phone call, but pleased that I kept my word by stopping by; we plan for me to visit tomorrow afternoon instead.  [address] [place], [place] [phone]

## 2012-06-26 NOTE — Progress Notes (Signed)
I have seen and examined this patient and I agree with the above. Abigail Mays 12:10 PM 06/26/2012

## 2012-06-26 NOTE — Progress Notes (Signed)
06/26/12 1200  Clinical Encounter Type  Visited With Patient;Health care provider Domingo Pulse, RN)  Visit Type Follow-up;Spiritual support;Social support  Referral From Nurse  Consult/Referral To Social work  Spiritual Encounters  Spiritual Needs Emotional;Grief support   Made lengthy visit with Sumer, who was tearful and very receptive to pastoral presence and spiritual care.  Served as a witness to her pain, strength, and hopefulness.  Provided space for her to process her mixed feelings about putting baby up for adoption.  Prayed with her by request.  Consulted with Nobie Putnam, CSW, who plans to follow up with her tomorrow morning, for referral resources related to counseling and similar support.  Will follow up personally this afternoon for further spiritual and emotional support.  [address] [place], [place] [phone]

## 2012-06-26 NOTE — Progress Notes (Signed)
Post Partum Day 1; Post-Op Day 1  Subjective: up ad lib, voiding and tolerating PO; reports no flatus and no BM yet. Complaining of back pain and abdominal pain, not controlled by medication.   Objective: Blood pressure 114/81, pulse 62, temperature 98.2 F (36.8 C), temperature source Oral, resp. rate 18, height 5\' 5"  (1.651 m), weight 87.998 kg (194 lb), last menstrual period 09/26/2011, SpO2 100.00%, unknown if currently breastfeeding.  Physical Exam:  General: alert, cooperative and no distress Lochia: appropriate Uterine Fundus: firm Incision: healing well, no significant drainage, no dehiscence, no significant erythema DVT Evaluation: No evidence of DVT seen on physical exam. Negative Homan's sign. No cords or calf tenderness. No significant calf/ankle edema.   Recent Labs  06/25/12 1315 06/26/12 0525  HGB 10.6* 10.2*  HCT 31.9* 31.1*    Assessment/Plan: Discharge home and Social Work consult   LOS: 1 day   Abigail Mays 06/26/2012, 8:00 AM   d

## 2012-06-26 NOTE — Discharge Summary (Signed)
Obstetric Discharge Summary Reason for Admission: onset of labor Prenatal Procedures: none Intrapartum Procedures: spontaneous vaginal delivery, tubal ligation and GBS prophylaxis Postpartum Procedures: none Complications-Operative and Postpartum: none Hemoglobin  Date Value Range Status  06/26/2012 10.2* 12.0 - 15.0 g/dL Final     HCT  Date Value Range Status  06/26/2012 31.1* 36.0 - 46.0 % Final    Physical Exam:  General: alert, cooperative and no distress Lochia: appropriate Uterine Fundus: firm Incision: healing well, no significant drainage, no dehiscence, no significant erythema DVT Evaluation: No evidence of DVT seen on physical exam. Negative Homan's sign. No cords or calf tenderness. No significant calf/ankle edema.  Discharge Diagnoses: Term Pregnancy-delivered; bilateral tubal ligation  Discharge Information: Date: 06/26/2012 Activity: unrestricted Diet: routine Medications: PNV, Ibuprofen and Percocet Condition: stable Instructions: refer to practice specific booklet Discharge to: home   Newborn Data: Live born female  Birth Weight: 7 lb 8.3 oz (3410 g) APGAR: 8, 9  Baby placed for adoption. Mother wishes for no contact with baby.  Abigail Mays 06/26/2012, 7:57 AM  I have seen and examined this patient and I agree with the above. Cam Hai 12:17 PM 06/26/2012

## 2012-06-26 NOTE — Progress Notes (Signed)
06/26/12 1100  Clinical Encounter Type  Visited With Patient  Visit Type Initial  Referral From Nurse   Received referrals yesterday from [facility] (when pt was approaching delivery, so not the right time) and today from [facility].  Apparently attorney was still present when I attempted visit just now, so I introduced myself briefly to Abigail Mays and plan to follow up later.  Please page as needed:  [phone].  Thank you.  [address] [place], [place] [phone]

## 2012-06-27 MED ORDER — IBUPROFEN 600 MG PO TABS: 600.0000 mg | ORAL_TABLET | Freq: Four times a day (QID) | ORAL | Status: DC | PRN

## 2012-06-27 MED ORDER — OXYCODONE-ACETAMINOPHEN 5-325 MG PO TABS: 1.0000 | ORAL_TABLET | Freq: Four times a day (QID) | ORAL | Status: DC | PRN

## 2012-06-27 MED ORDER — SERTRALINE HCL 50 MG PO TABS: 50.0000 mg | ORAL_TABLET | Freq: Every day | ORAL | Status: DC

## 2012-06-27 NOTE — Discharge Summary (Signed)
Obstetric Discharge Summary Reason for Admission: onset of labor Prenatal Procedures: none Intrapartum Procedures: spontaneous vaginal delivery Postpartum Procedures: none Complications-Operative and Postpartum: none  Patient very ambivalent regarding the adoption of her son.  Discharge planned for later today after completion of Social Work and [employer].  Tubal Ligation Completed during hospitalization.  Hemoglobin  Date Value Range Status  06/26/2012 10.2* 12.0 - 15.0 g/dL Final     HCT  Date Value Range Status  06/26/2012 31.1* 36.0 - 46.0 % Final    Physical Exam:  General: alert, cooperative and tearful during exam when discussing the adoption of her child Lochia: appropriate Uterine Fundus: firm Incision: healing well, no active bleeding DVT Evaluation: No evidence of DVT seen on physical exam.  Discharge Diagnoses: Term Pregnancy-delivered  Discharge Information: Date: 06/27/2012 Activity: pelvic rest Diet: routine Medications: Ibuprofen, Prenatal Vitamins, and Zoloft 50mg  daily Condition: stable and improved Instructions: Patient to follow-up in 2 days for wound check and in 6 weeks for post-partum visit. Discharge to: home  Follow-up Information   Follow up with [facility] In 2 weeks. (For postpartum appt; BTL incision check and postpartum depression screen)    Contact information:   [address] [place] [address] [phone]      Follow up with [facility] In 6 weeks. (Post-Partum Visit)    Contact information:   [address] [place] [address] [phone]      Newborn Data: Live born female  Birth Weight: 7 lb 8.3 oz (3410 g) APGAR: 8, 9  Home with Family.  [facility] 06/27/2012, 7:44 AM  I examined pt and agree with documentation above and resident plan of care.  Update:  Pt has definitely decided to keep infant.  Adoptive parents have left the hospital.   [facility]

## 2012-06-27 NOTE — Progress Notes (Signed)
06/27/12 1300  Clinical Encounter Type  Visited With Patient (baby at bedside)  Visit Type Follow-up;Spiritual support;Social support  Referral From Social work;Nurse  Spiritual Encounters  Spiritual Needs Emotional   Followed up with Abigail Mays after her decision to keep baby, having heard update from CSW and Charity fundraiser.  [name] was much more peaceful than when I saw her yesterday (very tearful at that point, and struggling with her desire to see and hold baby).  Provided space for her to tell her story and work through some of the mixed and evolving emotions she has been feeling.  She reports a sense of clarity and rightness, which her affect reflects, as well as good support from family at this time.  Pt very appreciative of pastoral presence and opportunities to reflect through this journey.  [address] [place], [place] [phone]

## 2012-06-28 NOTE — Clinical Social Work Maternal (Addendum)
LATE ENTRY FROM 06/27/12:  Clinical Social Work Department PSYCHOSOCIAL ASSESSMENT - MATERNAL/CHILD 06/28/2012  Patient:  Abigail Mays, Abigail Mays  Account Number:  [id number]  Admit Date:  06/25/2012  Marjo Bicker Name:   Abigail Mays    Clinical Social Worker:  Nobie Putnam, LCSW   Date/Time:  06/27/2012 10:00 AM  Date Referred:  06/27/2012   Referral source  CN     Referred reason  Adoption  Substance Abuse   Other referral source:    I:  FAMILY / HOME ENVIRONMENT Child's legal guardian:  PARENT  Guardian - Name Guardian - Age Guardian - Address  Tametra Zoll 27 [address]; [place] [address]  Linde Gillis 32    Other household support members/support persons Name Relationship DOB   DAUGHTER 2005   SON 2007  [employer] MOTHER   Delories Heinz [place] SISTER    Other support:   Family    II  PSYCHOSOCIAL DATA Information Source:  Patient Interview  Event organiser Employment:   [employer]   Financial resources:  Medicaid If Medicaid - County:  [employer] Other  Chemical engineer / Grade:   Maternity Care Coordinator / Child Services Coordination / Early Interventions:  Cultural issues impacting care:    III  STRENGTHS Strengths  Adequate Resources  Home prepared for Child (including basic supplies)  Supportive family/friends   Strength comment:    IV  RISK FACTORS AND CURRENT PROBLEMS Current Problem:  YES   Risk Factor & Current Problem Patient Issue Family Issue Risk Factor / Current Problem Comment  Mental Illness Y N Hx depression & SI  Substance Abuse Y N Hx of MJ use  Other - See comment Y N Adoption plan    V  SOCIAL WORK ASSESSMENT CSW met with tearful pt this morning, as she told CSW that she changed her mind about making an adoption plan.  Pt told CSW that she did not sleep last night, as she was overwhelmed with emotion.  Pt told CSW that she thought terminating the pregnancy at first but  then decided to make an adoption plan.  She contacted [employer], an adoption agency she found online & started working with them around 6 months of pregnancy.  Pt was introduced to [employer], in her 7th month of pregnancy.  They communicated via phone & met for the first time this past Saturday.  Pt told CSW that she really liked the couple when they met.  Pt explained her motivation for making a plan, as he talked about being a single mother of 2 children, unemployed & unstable housing during pregnancy. In addition, the FOB was not involved & agreed to adoption plan, as well.  Since then pt told CSW that she was secured a job & her housing situation is stable.  She is currently living with her mother but plans to move into [employer] when space comes available.  Pt told CSW that she was having second thoughts about going through with the plan & when she talked with her mother & sister (her primary support system), they were against it.  Pt told CSW that she wanted an open adoption & the Thurman Coyer did not agree on her ideas of an open adoption.  Pt is familiar with the adoption process because she made an adoption plan with her last child born 10/11.  She worked with [facility] & kept the pregnancy a secret from her family. According  to the pt, she has regular contact with the family who adopted her son, which is what she expected with this family.  Pt asked CSW to inform the adoptive parents of her decision to parent her child.   CSW talked about pt's history of depression & SI.  Pt told CSW that she was depressed, as result of "life issues."  She admits to [facility] which resulted in a [facility] admission.  Her symptoms were treated with anti-depressants.  She is on medication now. She expressed an interest in being referred to a counselor. 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  Pt was understanding  & denies previous CPS involvement.  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  She continued to smoke, daily upon pregnancy confirmation but decreased usage.  She denies other illegal substance use.  CSW contacted Amy Earlene Plater, attorney that assist pt with relinquishment paper work & informed her of pt's decision.  Since Amy is the adoptive parents attorney, she is not willing to come back to hospital to complete revocation paper work.  CSW contacted hospital attorney, Theola Sequin for counsel. CSW instructed to complete revocation paper work with pt & advised pt that she will need to follow the instructions listed on relinquishment, so that revocation is valid.  Pt understands & plans to mail a certified copy to the attorney's office.  CSW also faxed a copy to attorney's office.  Pt's family members are gathering baby supplies. Pt appears to be happy with her decision but "guilty" about facing the adoptive couple.  CSW will continue to assist until discharged.      VI SOCIAL WORK PLAN Social Work Plan  No Further Intervention Required / No Barriers to Discharge  Child Protective Services Report   Type of pt/family education:   If child protective services report - county:  [place] If child protective services report - date:  06/27/2012 Information/referral to community resources comment:   [facility]   Other social work plan:

## 2012-07-01 NOTE — Discharge Summary (Signed)
Attestation of Attending Supervision of Advanced Practitioner (CNM/NP): Evaluation and management procedures were performed by the Advanced Practitioner under my supervision and collaboration.  I have reviewed the Advanced Practitioner's note and chart, and I agree with the management and plan.  Abigail Mays 07/01/2012 11:28 AM

## 2012-07-15 ENCOUNTER — Ambulatory Visit (INDEPENDENT_AMBULATORY_CARE_PROVIDER_SITE_OTHER): Payer: Medicaid Other | Admitting: Family Medicine

## 2012-07-15 ENCOUNTER — Encounter: Payer: Self-pay | Admitting: Family Medicine

## 2012-07-15 VITALS — BP 125/90 | HR 68 | Temp 98.4°F | Ht 64.0 in | Wt 176.5 lb

## 2012-07-15 DIAGNOSIS — Z09 Encounter for follow-up examination after completed treatment for conditions other than malignant neoplasm: Secondary | ICD-10-CM

## 2012-07-15 DIAGNOSIS — Z302 Encounter for sterilization: Secondary | ICD-10-CM

## 2012-07-15 DIAGNOSIS — F329 Major depressive disorder, single episode, unspecified: Secondary | ICD-10-CM

## 2012-07-15 MED ORDER — SERTRALINE HCL 100 MG PO TABS: 50.0000 mg | ORAL_TABLET | Freq: Every day | ORAL | Status: DC

## 2012-07-15 NOTE — Patient Instructions (Signed)

## 2012-07-15 NOTE — Progress Notes (Signed)
  Subjective:    Patient ID: Abigail Mays, female    DOB: Jun 03, 1985, 27 y.o.   MRN: [id number]  HPI  S/p BTL after SVD and BUFA 2 wks ago.  Healing well.  Has long h/o depression with suicide attempt.  On zoloft, but not working well.  Needs higher dose.  Has f/u with therapist on 7/25.  Not acutely suicidal now.  Review of Systems  Genitourinary: Negative for vaginal bleeding.  Psychiatric/Behavioral: Negative for suicidal ideas.       Objective:   Physical Exam  Vitals reviewed. Constitutional: She appears well-developed and well-nourished.  Cardiovascular: Normal rate.   Pulmonary/Chest: Effort normal.  Abdominal: Soft. There is no tenderness.  incision is well healed.          Assessment & Plan:

## 2012-07-15 NOTE — Assessment & Plan Note (Signed)
Healing well.

## 2012-07-15 NOTE — Assessment & Plan Note (Signed)
Increased Zoloft to 100mg /day.  Suicide precautions

## 2012-07-31 ENCOUNTER — Ambulatory Visit: Payer: Medicaid Other | Admitting: Obstetrics and Gynecology

## 2012-08-02 ENCOUNTER — Ambulatory Visit (INDEPENDENT_AMBULATORY_CARE_PROVIDER_SITE_OTHER): Payer: Medicaid Other | Admitting: Family Medicine

## 2012-08-02 ENCOUNTER — Encounter: Payer: Self-pay | Admitting: Family Medicine

## 2012-08-02 DIAGNOSIS — F329 Major depressive disorder, single episode, unspecified: Secondary | ICD-10-CM

## 2012-08-02 DIAGNOSIS — O99345 Other mental disorders complicating the puerperium: Secondary | ICD-10-CM

## 2012-08-02 DIAGNOSIS — F53 Postpartum depression: Secondary | ICD-10-CM

## 2012-08-02 MED ORDER — DOCUSATE SODIUM 100 MG PO CAPS: 100.0000 mg | ORAL_CAPSULE | Freq: Two times a day (BID) | ORAL | Status: DC | PRN

## 2012-08-02 MED ORDER — SERTRALINE HCL 100 MG PO TABS: 100.0000 mg | ORAL_TABLET | Freq: Every day | ORAL | Status: DC

## 2012-08-02 NOTE — Patient Instructions (Signed)

## 2012-08-02 NOTE — Progress Notes (Signed)
CSW called by clinic staff to complete assessment for PPD.  CSW reviewed assessment documentation from patient's delivery on 06/25/12 completed by T. Slade/LCSW.  CSW met with patient who states she had been feeling depressed before delivery and spoke with C. Simpson/LCSW who recommended starting an antidepressant.  Patient states she was prescribed 50mg  and has been taking it regularly.  CSW explained that this is a good starter dose, but is not a very high dose of antidepressant.  CSW recommends increasing.  Patient states she informed her provider at her last visit that she did not feel like the medication was working and it had been increased to 100mg .  She states the pharmacy was unaware of this change when she went to pick up the prescription.  CSW informed D. Poe/CNM of this to follow up.  CSW recommends that patient see a psychiatrist for medication management and the possibility of adding additional medication.  Patient is open to this.  CSW informed her to go to the [facility] and gave patient the phone number and address and explained that it is a walk in clinic.  Patient agreed.  Patient states she had made a plan for adoption when she had her son in June, signed the paperwork, and then revoked her consent for adoption the next day.  She discussed the guilt she felt since the adoptive family was at the hospital ready to take baby home.  CSW validated her feelings, but encouraged her to remember that it is her baby and was her decision.  She agreed.  CSW informed patient of the importance of medication and talk therapy together.  Patient states she feels better when she talks about her feelings and would like to have a Veterinary surgeon.   CSW asked patient if she has started counseling, as T. Slade's note states a referral was made to [employer].  Patient states she never received a call and was not given the contact number.  CSW provided her with the number and encouraged her to call.  CSW  also left a message for S. Smith/LCSW at [facility] to follow up.  CSW asked if patient is experiencing any SI/HI at this time and she denied.  She reports feeling suicidal approximately 3 years ago and having an admission to [facility] at that time.  CSW asked patient to promise to call 911 or go to the nearest ER if she has any feelings of SI/HI in the future.  She agreed.  Patient was very pleasant and mood and affect appear positive.  She seems eager and willing to access services to improve her mental health.  CSW provided understanding and encouragement, which patient seemed to appreciate.  CSW discussed plan with provider.

## 2012-08-02 NOTE — Progress Notes (Addendum)
Subjective:    Abigail Mays is a 27 y.o. [phone] African American female who presents for a postpartum visit. She is 5 week postpartum following a spontaneous vaginal delivery. I have fully reviewed the prenatal and intrapartum course. The delivery was at 39.0 gestational weeks. Outcome: spontaneous vaginal delivery. Anesthesia: epidural. Postpartum course has been complicated by depression- seen by SW. Baby's course has been uncomplicated. Baby is feeding by bottle - gerber gentle. Bleeding no bleeding. Bowel function is normal- slightly painful. Bladder function is normal. Patient is not sexually active. Contraception method is tubal ligation. Postpartum depression screening: positive.  - planning on going back to work in the next month - mom helps with the kids at home - sleeping well   The following portions of the patient's history were reviewed and updated as appropriate: allergies, current medications, past medical history, past surgical history and problem list.  Review of Systems Pertinent items are noted in HPI.   Filed Vitals:   08/02/12 0834  BP: 115/79  Pulse: 67  Temp: 97.3 F (36.3 C)  TempSrc: Oral  Height: 5\' 5"  (1.651 m)  Weight: 175 lb (79.379 kg)    Objective:     General:  alert, cooperative and no distress   Breasts:  deferred, no complaints  Lungs: clear to auscultation bilaterally  Heart:  regular rate and rhythm  Abdomen: soft, minimal tenderness. BTL incision healing well.    Vulva: normal  Vagina: normal vagina  Cervix:  closed  Corpus: Well-involuted  Adnexa:  Non-palpable  Rectal Exam: no hemorrhoids        Assessment:   normal postpartum exam 5 wks s/p SVD Depression screening- positive. Seen by social work. Coping very well. No signs of depression on exam.  Contraception counseling   Plan:   Contraception: tubal ligation- inicision site healing well\ Depression- increase zoloft to 100mg . rx called in. discusssed need for  counseling and PCP to manage long term. Pt with no si/hi and overall very positive - constipation- encouraged continued stool softener. rx provided.    Follow up in: 1 year for routine care or as needed.    Audi Conover, Redmond Baseman, MD

## 2012-08-20 ENCOUNTER — Encounter: Payer: Self-pay | Admitting: *Deleted

## 2013-11-10 ENCOUNTER — Encounter: Payer: Self-pay | Admitting: Family Medicine

## 2015-02-28 ENCOUNTER — Emergency Department (HOSPITAL_COMMUNITY)
Admission: EM | Admit: 2015-02-28 | Discharge: 2015-02-28 | Disposition: A | Discharge status: Home / Self Care | Payer: Medicaid Other | Attending: Emergency Medicine | Admitting: Emergency Medicine

## 2015-02-28 ENCOUNTER — Encounter (HOSPITAL_COMMUNITY): Payer: Self-pay | Admitting: Emergency Medicine

## 2015-02-28 DIAGNOSIS — Z79899 Other long term (current) drug therapy: Secondary | ICD-10-CM | POA: Diagnosis not present

## 2015-02-28 DIAGNOSIS — L02416 Cutaneous abscess of left lower limb: Secondary | ICD-10-CM | POA: Diagnosis not present

## 2015-02-28 DIAGNOSIS — Z8619 Personal history of other infectious and parasitic diseases: Secondary | ICD-10-CM | POA: Insufficient documentation

## 2015-02-28 DIAGNOSIS — L0291 Cutaneous abscess, unspecified: Secondary | ICD-10-CM

## 2015-02-28 DIAGNOSIS — F329 Major depressive disorder, single episode, unspecified: Secondary | ICD-10-CM | POA: Insufficient documentation

## 2015-02-28 DIAGNOSIS — F419 Anxiety disorder, unspecified: Secondary | ICD-10-CM | POA: Insufficient documentation

## 2015-02-28 MED ORDER — OXYCODONE-ACETAMINOPHEN 5-325 MG PO TABS
1.0000 | ORAL_TABLET | Freq: Once | ORAL | Status: AC
  Administered 2015-02-28: 1 via ORAL
  Filled 2015-02-28: qty 1

## 2015-02-28 MED ORDER — HYDROCODONE-ACETAMINOPHEN 5-325 MG PO TABS: 1.0000 | ORAL_TABLET | ORAL | Status: DC | PRN

## 2015-02-28 MED ORDER — SULFAMETHOXAZOLE-TRIMETHOPRIM 800-160 MG PO TABS: 1.0000 | ORAL_TABLET | Freq: Two times a day (BID) | ORAL | Status: AC

## 2015-02-28 MED ORDER — LIDOCAINE HCL (PF) 1 % IJ SOLN
10.0000 mL | Freq: Once | INTRAMUSCULAR | Status: AC
  Administered 2015-02-28: 10 mL via INTRADERMAL
  Filled 2015-02-28: qty 10

## 2015-02-28 NOTE — Discharge Instructions (Signed)
Take the prescribed medication as directed.   May use warm compresses or do warm soaks in bathtub to help aid drainage.  Keep area covered while dressed or walking around to reduce friction to the area. Return to the ED for new or worsening symptoms.

## 2015-02-28 NOTE — ED Notes (Signed)
Pt from home with c/o abscess starting this past Thursday and worsening today.  NAD, A&O.

## 2015-02-28 NOTE — ED Provider Notes (Signed)
CSN: [id number]     Arrival date & time 02/28/15  0841 History   First MD Initiated Contact with Patient 02/28/15 [phone]     Chief Complaint  Patient presents with  . Abscess     (Consider location/radiation/quality/duration/timing/severity/associated sxs/prior Treatment) Patient is a 30 y.o. female presenting with abscess. The history is provided by the patient and medical records.  Abscess  30 y.o. F with hx of anxiety, depression, chlamydia, presenting to the ED for abscess of left inner thigh.  States she first noticed this on Thursday, however abscess has become larger and more painful since this time.  She denies hx of MRSA, prior abscesses, HIV, or DM.  Denies fever, chills, sweats.  States she has tried warm soaks and wearing different underpants without relief or improvement.  VSS.  Past Medical History  Diagnosis Date  . Abnormal Pap smear   . Anxiety   . Depression     No meds  . Chlamydia    Past Surgical History  Procedure Laterality Date  . No past surgeries    . Wisdom tooth extraction    . Tubal ligation Bilateral 06/25/2012    Procedure: POST PARTUM TUBAL LIGATION;  Surgeon: Reva Bores, MD;  Location: [facility];  Service: Gynecology;  Laterality: Bilateral;  with filshie clips   No family history on file. Social History  Substance Use Topics  . Smoking status: Never Smoker   . Smokeless tobacco: Never Used  . Alcohol Use: No   OB History    Gravida Para Term Preterm AB TAB SAB Ectopic Multiple Living   Review of Systems  Skin:       abscess  All other systems reviewed and are negative.     Allergies  Review of patient's allergies indicates no known allergies.  Home Medications   Prior to Admission medications   Medication Sig Start Date End Date Taking? Authorizing Provider  docusate sodium (COLACE) 100 MG capsule Take 1 capsule (100 mg total) by mouth 2 (two) times daily as needed for constipation. 08/02/12   Vale Haven, MD   ibuprofen (ADVIL,MOTRIN) 600 MG tablet Take 1 tablet (600 mg total) by mouth every 6 (six) hours as needed for pain. 06/27/12   Tommie Sams, DO  Prenatal Multivit-Min-Fe-FA (PRENATAL VITAMINS) 0.8 MG tablet Take 1 tablet by mouth daily. 05/13/12   Napoleon Form, MD  sertraline (ZOLOFT) 100 MG tablet Take 1 tablet (100 mg total) by mouth daily. 08/02/12   Vale Haven, MD   BP 131/82 mmHg  Pulse 85  Temp(Src) 98.6 F (37 C) (Oral)  Resp 18  Ht  (1.651 m)  Wt 85.73 kg  BMI 31.45 kg/m2  SpO2 100%   Physical Exam  Constitutional: She is oriented to person, place, and time. She appears well-developed and well-nourished. No distress.  HENT:  Head: Normocephalic and atraumatic.  Mouth/Throat: Oropharynx is clear and moist.  Eyes: Conjunctivae and EOM are normal. Pupils are equal, round, and reactive to light.  Neck: Normal range of motion. Neck supple.  Cardiovascular: Normal rate, regular rhythm and normal heart sounds.   Pulmonary/Chest: Effort normal and breath sounds normal. No respiratory distress. She has no wheezes.  Abdominal: Soft. Bowel sounds are normal. There is no tenderness. There is no guarding.  Musculoskeletal: Normal range of motion. She exhibits no edema.  Moderate sized abscess of left medial thigh approx 4cm in  diameter, central fluctuance and extreme tenderness to palpation, surrounding erythema and induration noted, approx 2cm in diameter  Neurological: She is alert and oriented to person, place, and time.  Skin: Skin is warm and dry. She is not diaphoretic.  Psychiatric: She has a normal mood and affect.  Nursing note and vitals reviewed.   ED Course  Procedures (including critical care time)  INCISION AND DRAINAGE Performed by: Garlon Hatchet Consent: Verbal consent obtained. Risks and benefits: risks, benefits and alternatives were discussed Type: abscess  Body area: left medial thigh  Anesthesia: local infiltration  Incision was made with a  scalpel.  Local anesthetic: lidocaine 1% without epinephrine  Anesthetic total: 5 ml  Complexity: complex Blunt dissection to break up loculations  Drainage: purulent  Drainage amount: moderate  Packing material: none  Patient tolerance: Patient tolerated the procedure well with no immediate complications.    Labs Review Labs Reviewed - No data to display  Imaging Review No results found. I have personally reviewed and evaluated these images and lab results as part of my medical decision-making.   EKG Interpretation None      MDM   Final diagnoses:  Abscess   30 y.o. F here with left medial thigh abscess, steadily increasing in size.  Patient afebrile, non-toxic.  Abscess is moderate in size, central fluctuance.  Slight surrounding erythema and induration.  I&D performed as above, patient tolerated well.  Will start on bactrim, vicodin.  Encouraged warm compresses/warm soaks at home.  Monitor at home for any worsening infection.  Discussed plan with patient, he/she acknowledged understanding and agreed with plan of care.  Return precautions given for new or worsening symptoms.  Garlon Hatchet, PA-C 02/28/15 1348  Laurence Spates, MD 03/01/15 [phone]

## 2015-10-17 ENCOUNTER — Emergency Department (HOSPITAL_COMMUNITY)
Admission: EM | Admit: 2015-10-17 | Discharge: 2015-10-17 | Disposition: A | Discharge status: Home / Self Care | Payer: Medicaid Other | Attending: Emergency Medicine | Admitting: Emergency Medicine

## 2015-10-17 ENCOUNTER — Encounter (HOSPITAL_COMMUNITY): Payer: Self-pay

## 2015-10-17 ENCOUNTER — Emergency Department (HOSPITAL_COMMUNITY): Payer: Medicaid Other

## 2015-10-17 DIAGNOSIS — Z87891 Personal history of nicotine dependence: Secondary | ICD-10-CM | POA: Insufficient documentation

## 2015-10-17 DIAGNOSIS — R1031 Right lower quadrant pain: Secondary | ICD-10-CM | POA: Insufficient documentation

## 2015-10-17 LAB — URINE MICROSCOPIC-ADD ON: Bacteria, UA: NONE SEEN

## 2015-10-17 LAB — COMPREHENSIVE METABOLIC PANEL
ALT: 11 U/L — ABNORMAL LOW (ref 14–54)
AST: 16 U/L (ref 15–41)
Albumin: 3.6 g/dL (ref 3.5–5.0)
Alkaline Phosphatase: 51 U/L (ref 38–126)
Anion gap: 8 (ref 5–15)
BUN: 8 mg/dL (ref 6–20)
CO2: 24 mmol/L (ref 22–32)
Calcium: 9 mg/dL (ref 8.9–10.3)
Chloride: 109 mmol/L (ref 101–111)
Creatinine, Ser: 0.9 mg/dL (ref 0.44–1.00)
GFR calc Af Amer: >60 mL/min (ref >60)
GFR calc non Af Amer: >60 mL/min (ref >60)
Glucose, Bld: 96 mg/dL (ref 65–99)
Potassium: 3.6 mmol/L (ref 3.5–5.1)
Sodium: 141 mmol/L (ref 135–145)
Total Bilirubin: 0.3 mg/dL (ref 0.3–1.2)
Total Protein: 6.8 g/dL (ref 6.5–8.1)

## 2015-10-17 LAB — I-STAT BETA HCG BLOOD, ED (MC, WL, AP ONLY): I-stat hCG, quantitative: <5 m[IU]/mL (ref <5)

## 2015-10-17 LAB — CBC
HCT: 41 % (ref 36.0–46.0)
Hemoglobin: 13.4 g/dL (ref 12.0–15.0)
MCH: 30.6 pg (ref 26.0–34.0)
MCHC: 32.7 g/dL (ref 30.0–36.0)
MCV: 93.6 fL (ref 78.0–100.0)
Platelets: 280 10*3/uL (ref 150–400)
RBC: 4.38 MIL/uL (ref 3.87–5.11)
RDW: 13.6 % (ref 11.5–15.5)
WBC: 9.8 10*3/uL (ref 4.0–10.5)

## 2015-10-17 LAB — URINALYSIS, ROUTINE W REFLEX MICROSCOPIC
Bilirubin Urine: NEGATIVE
Glucose, UA: NEGATIVE mg/dL
Ketones, ur: NEGATIVE mg/dL
Nitrite: NEGATIVE
Protein, ur: NEGATIVE mg/dL
Specific Gravity, Urine: 1.025 (ref 1.005–1.030)
pH: 6 (ref 5.0–8.0)

## 2015-10-17 LAB — LIPASE, BLOOD: Lipase: 52 U/L — ABNORMAL HIGH (ref 11–51)

## 2015-10-17 MED ORDER — POLYETHYLENE GLYCOL 3350 17 G PO PACK: 17.0000 g | PACK | Freq: Every day | ORAL | 0 refills | Status: DC

## 2015-10-17 MED ORDER — KETOROLAC TROMETHAMINE 30 MG/ML IJ SOLN
30.0000 mg | Freq: Once | INTRAMUSCULAR | Status: AC
  Administered 2015-10-17: 30 mg via INTRAMUSCULAR
  Filled 2015-10-17: qty 1

## 2015-10-17 NOTE — Discharge Instructions (Signed)
Your exam was reassuring. Please take your medications as prescribed. Return to ED for new or worsening symptoms as we discussed.

## 2015-10-17 NOTE — ED Triage Notes (Signed)
Patient complains of lower abdominal pain that started yesterday. No nausea, no vomiting, no diarrhea. LBM yesterday. denies dysuria.

## 2015-10-17 NOTE — ED Notes (Signed)
Patient transported to X-ray 

## 2015-10-17 NOTE — ED Notes (Signed)
Pt. Verbalized understanding of discharge teaching and home care. Ambulatory VSS.

## 2015-10-17 NOTE — ED Provider Notes (Signed)
[facility] DEPT Provider Note   CSN: [id number] Arrival date & time: 10/17/15  [id number]     History   Chief Complaint Chief Complaint  Patient presents with  . Abdominal Pain    HPI Abigail Mays is a 30 y.o. female.  HPI here for evaluation of gradual onset right lower abdominal discomfort. Patient reports symptoms started yesterday and were mild but have steadily worsened and are now constant today. She reports the discomfort feels like "contractions or gas pains". Radiates to back. She denies any other fevers, chills, nausea or vomiting, other abdominal pain, urinary symptoms, vaginal bleeding or discharge. Just finished her menstrual cycle and stated it was normal. 3 living children, all vaginal delivery. No abdominal surgeries. Nothing tried to improve symptoms. Standing straight up worsens discomfort.  Past Medical History:  Diagnosis Date  . Abnormal Pap smear   . Anxiety   . Chlamydia   . Depression    No meds    Patient Active Problem List   Diagnosis Date Noted  . Sterilization 06/25/2012  . Normal delivery 06/25/2012  . Insufficient prenatal care 05/30/2012  . Depressive disorder, not elsewhere classified 05/30/2012    Past Surgical History:  Procedure Laterality Date  . TUBAL LIGATION Bilateral 06/25/2012   Procedure: POST PARTUM TUBAL LIGATION;  Surgeon: Reva Boresanya S Pratt, MD;  Location: [facility];  Service: Gynecology;  Laterality: Bilateral;  with filshie clips    OB History    Gravida Para Term Preterm AB Living   4 4 3 1   4    SAB TAB Ectopic Multiple Live Births           1       Home Medications    Prior to Admission medications   Medication Sig Start Date End Date Taking? Authorizing Provider  HYDROcodone-acetaminophen (NORCO/VICODIN) 5-325 MG tablet Take 1 tablet by mouth every 4 (four) hours as needed. Patient not taking: Reported on 10/17/2015 02/28/15   Garlon HatchetLisa M Sanders, PA-C  polyethylene glycol [facility](MIRALAX / Ethelene HalGLYCOLAX) packet Take 17 g by  mouth daily. 10/17/15   Joycie PeekBenjamin Coyle Stordahl, PA-C    Family History No family history on file.  Social History Social History  Substance Use Topics  . Smoking status: Former Smoker    Quit date: 01/24/2012  . Smokeless tobacco: Never Used  . Alcohol use No     Allergies   Review of patient's allergies indicates no known allergies.   Review of Systems Review of Systems A 10 point review of systems was completed and was negative except for pertinent positives and negatives as mentioned in the history of present illness    Physical Exam Updated Vital Signs BP 113/80   Pulse (!) 54   Temp 97.9 F (36.6 C) (Oral)   Resp 20   SpO2 100%   Physical Exam  Constitutional: She appears well-developed. No distress.  Awake, alert and nontoxic in appearance  HENT:  Head: Normocephalic and atraumatic.  Right Ear: External ear normal.  Left Ear: External ear normal.  Mouth/Throat: Oropharynx is clear and moist.  Eyes: Conjunctivae and EOM are normal. Pupils are equal, round, and reactive to light.  Neck: Normal range of motion. No JVD present.  Cardiovascular: Normal rate, regular rhythm and normal heart sounds.   Pulmonary/Chest: Effort normal and breath sounds normal. No stridor.  Abdominal: Soft. There is no tenderness.  Tenderness to palpation in suprapubic and right lower quadrant. No rebound. Abdomen is soft without distention. No peritoneal signs. No CVA tenderness  Musculoskeletal: Normal range of motion.  Neurological:  Awake, alert, cooperative and aware of situation; motor strength bilaterally; sensation normal to light touch bilaterally; no facial asymmetry; tongue midline; major cranial nerves appear intact;  baseline gait without new ataxia.  Skin: No rash noted. She is not diaphoretic.  Psychiatric: She has a normal mood and affect. Her behavior is normal. Thought content normal.  Nursing note and vitals reviewed.    ED Treatments / Results  Labs (all labs ordered  are listed, but only abnormal results are displayed) Labs Reviewed  LIPASE, BLOOD - Abnormal; Notable for the following:       Result Value   Lipase 52 (*)    All other components within normal limits  COMPREHENSIVE METABOLIC PANEL - Abnormal; Notable for the following:    ALT 11 (*)    All other components within normal limits  URINALYSIS, ROUTINE W REFLEX MICROSCOPIC (NOT AT [facility]) - Abnormal; Notable for the following:    APPearance HAZY (*)    Hgb urine dipstick MODERATE (*)    Leukocytes, UA TRACE (*)    All other components within normal limits  URINE MICROSCOPIC-ADD ON - Abnormal; Notable for the following:    Squamous Epithelial / LPF 0-5 (*)    All other components within normal limits  CBC  I-STAT BETA HCG BLOOD, ED ([facility], [facility], [facility] ONLY)    EKG  EKG Interpretation None       Radiology Dg Abd Acute W/chest  Result Date: 10/17/2015 CLINICAL DATA:  Abdominal cramping for 2 days EXAM: DG ABDOMEN ACUTE W/ 1V CHEST COMPARISON:  None. FINDINGS: PA chest: No edema or consolidation. Heart size and pulmonary vascularity are normal. No adenopathy. Supine and upright abdomen: There is moderate stool throughout the colon. There is no bowel dilatation or air-fluid level suggesting bowel obstruction. No free air. There are clips in the pelvis. IMPRESSION: Moderate stool in colon. Bowel gas pattern unremarkable. No bowel obstruction or free air. No lung edema or consolidation. Electronically Signed   By: Bretta Bang III M.D.   On: 10/17/2015 09:40    Procedures Procedures (including critical care time)  Medications Ordered in ED Medications  ketorolac (TORADOL) 30 MG/ML injection 30 mg (30 mg Intramuscular Given 10/17/15 0943)    Vitals:   10/17/15 0948 10/17/15 0950 10/17/15 1000 10/17/15 1030  BP: 131/69  120/84 113/80  Pulse:  (!) 59 (!) 54 (!) 54  Resp:      Temp:      TempSrc:      SpO2:  100% 100% 100%    Initial Impression / Assessment and Plan / ED Course  I have  reviewed the triage vital signs and the nursing notes.  Pertinent labs & imaging results that were available during my care of the patient were reviewed by me and considered in my medical decision making (see chart for details).  Clinical Course    Patient with abdominal discomfort, right lower quadrant abdominal discomfort she characterizes gas pains. No other associated symptoms. Pregnancy negative, urine without overt evidence of infection.There is moderate hemoglobin, patient possibly passed stone. Plain films of the abdomen showed moderate stool and unremarkable bowel gas pattern. Patient reports significant relief after Toradol injection.  Low suspicion for appendicitis, TOA, torsion or other emergent intra-abdominal pathology at this time. However, given strict return precautions.. She understands to return to emergency department if her symptoms do not improve or worsen for further evaluation. Discharged with prescription for MiraLAX.  Final Clinical Impressions(s) /  ED Diagnoses   Final diagnoses:  Right lower quadrant abdominal pain    New Prescriptions New Prescriptions   POLYETHYLENE GLYCOL (MIRALAX / GLYCOLAX) PACKET    Take 17 g by mouth daily.     Joycie Peek, PA-C 10/17/15 1239    Raeford Razor, MD 10/19/15 0700

## 2016-02-06 ENCOUNTER — Encounter (HOSPITAL_COMMUNITY): Payer: Self-pay | Admitting: *Deleted

## 2016-02-06 ENCOUNTER — Emergency Department (HOSPITAL_COMMUNITY)
Admission: EM | Admit: 2016-02-06 | Discharge: 2016-02-07 | Disposition: A | Discharge status: Home / Self Care | Payer: Managed Care, Other (non HMO) | Attending: Emergency Medicine | Admitting: Emergency Medicine

## 2016-02-06 DIAGNOSIS — K0889 Other specified disorders of teeth and supporting structures: Secondary | ICD-10-CM | POA: Diagnosis present

## 2016-02-06 DIAGNOSIS — Z87891 Personal history of nicotine dependence: Secondary | ICD-10-CM | POA: Diagnosis not present

## 2016-02-06 DIAGNOSIS — G500 Trigeminal neuralgia: Secondary | ICD-10-CM | POA: Insufficient documentation

## 2016-02-06 DIAGNOSIS — R519 Headache, unspecified: Secondary | ICD-10-CM

## 2016-02-06 DIAGNOSIS — R51 Headache: Secondary | ICD-10-CM

## 2016-02-06 MED ORDER — OXYCODONE-ACETAMINOPHEN 5-325 MG PO TABS
1.0000 | ORAL_TABLET | Freq: Once | ORAL | Status: AC
  Administered 2016-02-06: 1 via ORAL
  Filled 2016-02-06: qty 1

## 2016-02-06 NOTE — ED Provider Notes (Signed)
[facility] Provider Note   CSN: [id number] Arrival date & time: 02/06/16  2147   By signing my name below, I, Abigail Mays and Abigail Mays , attest that this documentation has been prepared under the direction and in the presence of [facility] M. Damian Leavell, NP Electronically Signed: Garen Mays and Abigail Mays , Scribe. 02/06/2016. 11:35 PM.   History   Chief Complaint Chief Complaint  Patient presents with  . Dental Problem  .  The history is provided by the patient. No language interpreter was used.  Dental Pain   This is a new problem. The current episode started 6 to 12 hours ago. The problem occurs constantly. The problem has been rapidly worsening. The pain is moderate. She has tried nothing for the symptoms. The treatment provided no relief.    HPI Comments:  Abigail Mays is a 31 y.o. female who presents to the Emergency Department complaining of sudden onset, intermittent throbbing right-sided gingival pain onset earlier today and worsened at 5PM to be constant. Pt reports radiation of pain to the right throat and the right face. She states her pain is worsened with swallowing. Pt denies taking OTC medications at home to improve symptoms. No sick contact with similar symptoms. She denies ear pain, abdominal pain, difficulty swallowing.   Past Medical History:  Diagnosis Date  . Abnormal Pap smear   . Anxiety   . Chlamydia   . Depression    No meds    Patient Active Problem List   Diagnosis Date Noted  . Sterilization 06/25/2012  . Normal delivery 06/25/2012  . Insufficient prenatal care 05/30/2012  . Depressive disorder, not elsewhere classified 05/30/2012    Past Surgical History:  Procedure Laterality Date  . TUBAL LIGATION Bilateral 06/25/2012   Procedure: POST PARTUM TUBAL LIGATION;  Surgeon: Reva Bores, MD;  Location: [facility];  Service: Gynecology;  Laterality: Bilateral;  with filshie clips    OB History    Gravida Para Term Preterm AB Living   4 4 3  1   4    SAB TAB Ectopic Multiple Live Births           1       Home Medications    Prior to Admission medications   Medication Sig Start Date End Date Taking? Authorizing Provider  cyclobenzaprine (FLEXERIL) 10 MG tablet Take 1 tablet (10 mg total) by mouth 2 (two) times daily as needed for muscle spasms. 02/07/16   Hope Orlene Och, NP  HYDROcodone-acetaminophen (NORCO/VICODIN) 5-325 MG tablet Take 1 tablet by mouth every 4 (four) hours as needed. Patient not taking: Reported on 10/17/2015 02/28/15   Garlon Hatchet, PA-C  polyethylene glycol [facility] / Ethelene Hal) packet Take 17 g by mouth daily. 10/17/15   Joycie Peek, PA-C    Family History No family history on file.  Social History Social History  Substance Use Topics  . Smoking status: Former Smoker    Quit date: 01/24/2012  . Smokeless tobacco: Never Used  . Alcohol use No     Allergies   Patient has no known allergies.   Review of Systems Review of Systems  HENT: Positive for sore throat. Negative for ear pain, sinus pain and trouble swallowing.        Right side facial pain  Eyes: Negative for pain and redness.  Respiratory: Negative for cough and shortness of breath.   Cardiovascular: Negative for chest pain.  Gastrointestinal: Negative for abdominal pain, nausea and vomiting.  Musculoskeletal: Neck  pain: right side.  Skin: Negative for wound.  Neurological: Negative for headaches.  Psychiatric/Behavioral: Negative for confusion.     Physical Exam Updated Vital Signs BP 130/82 (BP Location: Right Arm)   Pulse 88   Temp 98.3 F (36.8 C) (Oral)   Resp 16   Ht 5\' 5"  (1.651 m)   Wt 78 kg   LMP 01/23/2016   SpO2 98%   BMI 28.62 kg/m   Physical Exam  Constitutional: She is oriented to person, place, and time. She appears well-developed and well-nourished. No distress.  HENT:  Head: Head is without right periorbital erythema and without left periorbital erythema.  Right Ear: Tympanic membrane normal.    Left Ear: Tympanic membrane normal.  Nose: No mucosal edema.  Mouth/Throat: Uvula is midline and mucous membranes are normal. No oral lesions. No dental abscesses, uvula swelling or dental caries. Posterior oropharyngeal erythema present. No oropharyngeal exudate or posterior oropharyngeal edema.  Bilateral TM's normal.  No facial weakness On oral exam there are no dental caries noted on the right, there is no tenderness with palpation of the teeth or gums.   Eyes: Conjunctivae are normal.  Neck: Normal range of motion. Neck supple.  Cardiovascular: Normal rate and regular rhythm.   Pulmonary/Chest: Effort normal.  Abdominal: Soft. Bowel sounds are normal. She exhibits no distension. There is no tenderness.  Musculoskeletal: Normal range of motion. She exhibits no deformity.  Lymphadenopathy:    She has cervical adenopathy (Anterior).  Neurological: She is alert and oriented to person, place, and time.  Skin: Skin is warm and dry.  Psychiatric: She has a normal mood and affect. Her behavior is normal.  Nursing note and vitals reviewed.    ED Treatments / Results  DIAGNOSTIC STUDIES:  Oxygen Saturation is 98% on RA, normal by my interpretation.    COORDINATION OF CARE:  11:42 PM Discussed treatment plan with pt at bedside including pain medication and rapid strep test and pt agreed to plan.  Labs (all labs ordered are listed, but only abnormal results are displayed) Labs Reviewed  RAPID STREP SCREEN (NOT AT [facility])  CULTURE, GROUP A STREP [facility])   Radiology Dg Orthopantogram  Result Date: 02/07/2016 CLINICAL DATA:  Right-sided facial pain upon awakening this morning. EXAM: ORTHOPANTOGRAM/PANORAMIC COMPARISON:  None. FINDINGS: No focal bone lesion of the mandible. No fracture or other acute bony abnormality. Grossly intact dentition, without significant lucency or bony destruction. IMPRESSION: No acute findings. Electronically Signed   By: Ellery Plunkaniel R Mitchell M.D.   On: 02/07/2016  01:32    Procedures Procedures (including critical care time)  Medications Ordered in ED Medications  oxyCODONE-acetaminophen (PERCOCET/ROXICET) 5-325 MG per tablet 1 tablet (1 tablet Oral Given 02/06/16 2347)  predniSONE (DELTASONE) tablet 60 mg (60 mg Oral Given 02/07/16 0112)  cyclobenzaprine (FLEXERIL) tablet 5 mg (5 mg Oral Given 02/07/16 0112)     Initial Impression / Assessment and Plan / ED Course  I have reviewed the triage vital signs and the nursing notes.  Pertinent labs & imaging results that were available during my care of the patient were reviewed by me and considered in my medical decision making (see chart for details).     Patient with right side facial pain and right side throat pain. Rapid strep negative. No abscess.  Exam not concerning for Ludwig's angina or pharyngeal abscess. X-ray negative for dental abscess. After Flexeril patient reports completely resolved pain.  Will treat with Rx of flexeril and patient to f/u with her PCP.Discussed  return precautions. Pt safe for discharge.  I discussed this case with Dr. Wilkie Aye.   Final Clinical Impressions(s) / ED Diagnoses   Final diagnoses:  Right sided facial pain  Trigeminal neuralgia    New Prescriptions New Prescriptions   CYCLOBENZAPRINE (FLEXERIL) 10 MG TABLET    Take 1 tablet (10 mg total) by mouth 2 (two) times daily as needed for muscle spasms.   I personally performed the services described in this documentation, which was scribed in my presence. The recorded information has been reviewed and is accurate.     [address] [place], [place] 02/07/16 0865    Shon Baton, MD 02/07/16 [phone]

## 2016-02-06 NOTE — ED Triage Notes (Signed)
Rt face and jaw pain just started today  Tooth pain lmp 2 weeks

## 2016-02-07 ENCOUNTER — Emergency Department (HOSPITAL_COMMUNITY): Payer: Managed Care, Other (non HMO)

## 2016-02-07 LAB — RAPID STREP SCREEN (MED CTR MEBANE ONLY): Streptococcus, Group A Screen (Direct): NEGATIVE

## 2016-02-07 MED ORDER — CYCLOBENZAPRINE HCL 10 MG PO TABS: 10.0000 mg | ORAL_TABLET | Freq: Two times a day (BID) | ORAL | 0 refills | Status: DC | PRN

## 2016-02-07 MED ORDER — CYCLOBENZAPRINE HCL 10 MG PO TABS
5.0000 mg | ORAL_TABLET | Freq: Once | ORAL | Status: AC
  Administered 2016-02-07: 5 mg via ORAL
  Filled 2016-02-07: qty 1

## 2016-02-07 MED ORDER — PREDNISONE 20 MG PO TABS
60.0000 mg | ORAL_TABLET | Freq: Once | ORAL | Status: AC
  Administered 2016-02-07: 60 mg via ORAL
  Filled 2016-02-07: qty 3

## 2016-02-07 NOTE — ED Notes (Signed)
Pt understood dc material. NAD noted,. Script given at [facility]

## 2016-02-07 NOTE — ED Notes (Signed)
Patient transported to X-ray 

## 2016-02-07 NOTE — Discharge Instructions (Signed)
Do not drive while taking the muscle relaxant as it can make you sleepy. Make an appointment with your primary care doctor for follow up. Return here as needed.

## 2016-02-07 NOTE — ED Notes (Signed)
Provider notified of pt stating the pain medication made the pain worse

## 2016-02-09 LAB — CULTURE, GROUP A STREP (THRC)

## 2016-07-29 ENCOUNTER — Encounter (HOSPITAL_COMMUNITY): Payer: Self-pay | Admitting: *Deleted

## 2016-07-29 ENCOUNTER — Emergency Department (HOSPITAL_COMMUNITY)
Admission: EM | Admit: 2016-07-29 | Discharge: 2016-07-29 | Disposition: A | Discharge status: Home / Self Care | Payer: Medicaid Other | Attending: Emergency Medicine | Admitting: Emergency Medicine

## 2016-07-29 DIAGNOSIS — Z87891 Personal history of nicotine dependence: Secondary | ICD-10-CM | POA: Insufficient documentation

## 2016-07-29 DIAGNOSIS — R6884 Jaw pain: Secondary | ICD-10-CM

## 2016-07-29 DIAGNOSIS — K0889 Other specified disorders of teeth and supporting structures: Secondary | ICD-10-CM | POA: Diagnosis present

## 2016-07-29 MED ORDER — CYCLOBENZAPRINE HCL 10 MG PO TABS: 10.0000 mg | ORAL_TABLET | Freq: Two times a day (BID) | ORAL | 0 refills | Status: DC | PRN

## 2016-07-29 MED ORDER — CYCLOBENZAPRINE HCL 10 MG PO TABS
10.0000 mg | ORAL_TABLET | Freq: Once | ORAL | Status: AC
  Administered 2016-07-29: 10 mg via ORAL
  Filled 2016-07-29: qty 1

## 2016-07-29 NOTE — ED Provider Notes (Signed)
[facility]-EMERGENCY DEPT Provider Note   CSN: [id number] Arrival date & time: 07/29/16  1754  By signing my name below, I, Ny'Kea Lewis, attest that this documentation has been prepared under the direction and in the presence of Melburn HakeNicole Kortney Potvin, PA-C.  Electronically Signed: Karren CobbleNy'Kea Lewis, ED Scribe. 07/29/16. 6:40 PM.  History   Chief Complaint Chief Complaint  Patient presents with  . Dental Pain   The history is provided by the patient. No language interpreter was used.   HPI Comments: Abigail Mays is a 31 y.o. female with no pertinent history, who presents to the Emergency Department complaining of sudden onset, persistent, pain to the left side of her jaw that began three hours ago. Pt reports she was awakened out of her sleep to the pain in the front right side of her upper and lower gums. She reports a similar episode that occurred about seven months ago, to the right side of her mouth. She was seen in the ED and given Percocet, with no relief, but she states after receiving a Flexeril her symptoms resolved. She has tried Tylenol and Ibuprofen with no relief. Denies fever, facial or neck swelling, nausea, vomiting, trouble swallowing,or drainage in her mouth. Denies any recent facial or mouth trauma.   Past Medical History:  Diagnosis Date  . Abnormal Pap smear   . Anxiety   . Chlamydia   . Depression    No meds   Patient Active Problem List   Diagnosis Date Noted  . Sterilization 06/25/2012  . Normal delivery 06/25/2012  . Insufficient prenatal care 05/30/2012  . Depressive disorder, not elsewhere classified 05/30/2012   Past Surgical History:  Procedure Laterality Date  . TUBAL LIGATION Bilateral 06/25/2012   Procedure: POST PARTUM TUBAL LIGATION;  Surgeon: Reva Boresanya S Pratt, MD;  Location: [facility];  Service: Gynecology;  Laterality: Bilateral;  with filshie clips   OB History    Gravida Para Term Preterm AB Living   4 4 3 1   4    SAB TAB Ectopic Multiple Live Births         1     Home Medications    Prior to Admission medications   Medication Sig Start Date End Date Taking? Authorizing Provider  cyclobenzaprine (FLEXERIL) 10 MG tablet Take 1 tablet (10 mg total) by mouth 2 (two) times daily as needed for muscle spasms. 07/29/16   Barrett HenleNadeau, Herrick Hartog Elizabeth, PA-C  HYDROcodone-acetaminophen (NORCO/VICODIN) 5-325 MG tablet Take 1 tablet by mouth every 4 (four) hours as needed. Patient not taking: Reported on 10/17/2015 02/28/15   Garlon HatchetSanders, Lisa M, PA-C  polyethylene glycol [facility](MIRALAX / Ethelene HalGLYCOLAX) packet Take 17 g by mouth daily. 10/17/15   Joycie Peekartner, Benjamin, PA-C    Family History No family history on file.  Social History Social History  Substance Use Topics  . Smoking status: Former Smoker    Quit date: 01/24/2012  . Smokeless tobacco: Never Used  . Alcohol use No   Allergies   Patient has no known allergies.  Review of Systems Review of Systems  Constitutional: Negative for fever.  HENT: Positive for dental problem. Negative for facial swelling and trouble swallowing.   Gastrointestinal: Negative for nausea and vomiting.   Physical Exam Updated Vital Signs BP 131/90 (BP Location: Right Arm)   Pulse 89   Temp 98.2 F (36.8 C) (Oral)   Resp 18   Ht 5\' 5"  (1.651 m)   Wt 80.7 kg (178 lb)   LMP 07/22/2016   SpO2 97%  BMI 29.62 kg/m   Physical Exam  Constitutional: She is oriented to person, place, and time. She appears well-developed and well-nourished.  HENT:  Head: Normocephalic and atraumatic.  Mouth/Throat: Uvula is midline, oropharynx is clear and moist and mucous membranes are normal. No trismus in the jaw. Normal dentition. No dental abscesses, uvula swelling or dental caries. No oropharyngeal exudate, posterior oropharyngeal edema, posterior oropharyngeal erythema or tonsillar abscesses. No tonsillar exudate.  No facial or neck swelling. No trismus or drooling. Pt tolerating secretions. No tenderness along bilateral jaw line or TMJs.     Eyes: Conjunctivae and EOM are normal. Right eye exhibits no discharge. Left eye exhibits no discharge. No scleral icterus.  Neck: Normal range of motion. Neck supple.  Cardiovascular: Normal rate, regular rhythm, normal heart sounds and intact distal pulses.   Pulmonary/Chest: Effort normal and breath sounds normal.  Abdominal: Soft. She exhibits no distension. There is no tenderness.  Musculoskeletal: Normal range of motion. She exhibits no edema.  Lymphadenopathy:    She has no cervical adenopathy.  Neurological: She is alert and oriented to person, place, and time.  Skin: Skin is warm and dry.  Nursing note and vitals reviewed.   ED Treatments / Results  DIAGNOSTIC STUDIES: Oxygen Saturation is 97% on RA, adqueate by my interpretation.   COORDINATION OF CARE: 6:30 PM-Discussed next steps with pt. Pt verbalized understanding and is agreeable with the plan.   Labs (all labs ordered are listed, but only abnormal results are displayed) Labs Reviewed - No data to display  EKG  EKG Interpretation None       Radiology No results found.  Procedures Procedures (including critical care time)  Medications Ordered in ED Medications  cyclobenzaprine (FLEXERIL) tablet 10 mg (10 mg Oral Given 07/29/16 1921)     Initial Impression / Assessment and Plan / ED Course  I have reviewed the triage vital signs and the nursing notes.  Pertinent labs & imaging results that were available during my care of the patient were reviewed by me and considered in my medical decision making (see chart for details).     Patient presents with left-sided jaw pain that started earlier today after waking up from a nap. Denies any specific injury or trauma. Denies fever, facial swelling, dental pain. Reports having similar episode of pain in the past which was treated with Flexeril in the ED with resolution of symptoms. VSS. Exam unremarkable. No evidence of dental infection/abscess, no trismus or  drooling, no facial or neck swelling. MMM. Neck supple with FROM. No TMJ tenderness. Patient given dose of Flexeril in the ED. On reevaluation patient reports resolution of symptoms. Suspect patient's symptoms are likely related to muscle spasm. Plan to discharge patient home with muscle relaxants and symptomatic treatment. Advised to follow up with PCP as needed. Discussed return precautions.  Final Clinical Impressions(s) / ED Diagnoses   Final diagnoses:  Jaw pain    New Prescriptions Discharge Medication List as of 07/29/2016  8:13 PM     I personally performed the services described in this documentation, which was scribed in my presence. The recorded information has been reviewed and is accurate.     Barrett Henle, PA-C 07/29/16 Jules Schick    Jerelyn Scott, MD 07/29/16 2350

## 2016-07-29 NOTE — ED Triage Notes (Signed)
The pt is c/o mo[place] pain  All day hx of the same but its been awhile

## 2016-07-29 NOTE — Discharge Instructions (Signed)
Take your medication as prescribed as needed for pain/muscle spasm. Recommend 5 with a primary care provider listed below her symptoms have not improved over the next week. Please return to the Emergency Department if symptoms worsen or new onset of fever, facial/neck swelling, unable to open jaw completely, drooling just being unable to swallow, new/worsening pain.

## 2016-09-29 ENCOUNTER — Emergency Department (HOSPITAL_COMMUNITY): Payer: Self-pay

## 2016-09-29 ENCOUNTER — Emergency Department (HOSPITAL_COMMUNITY)
Admission: EM | Admit: 2016-09-29 | Discharge: 2016-09-29 | Disposition: A | Discharge status: Home / Self Care | Payer: Self-pay | Attending: Emergency Medicine | Admitting: Emergency Medicine

## 2016-09-29 ENCOUNTER — Encounter (HOSPITAL_COMMUNITY): Payer: Self-pay

## 2016-09-29 DIAGNOSIS — Z87891 Personal history of nicotine dependence: Secondary | ICD-10-CM | POA: Insufficient documentation

## 2016-09-29 DIAGNOSIS — K0889 Other specified disorders of teeth and supporting structures: Secondary | ICD-10-CM | POA: Insufficient documentation

## 2016-09-29 LAB — RAPID STREP SCREEN (MED CTR MEBANE ONLY): STREPTOCOCCUS, GROUP A SCREEN (DIRECT): NEGATIVE

## 2016-09-29 MED ORDER — NAPROXEN 500 MG PO TABS: 500.0000 mg | ORAL_TABLET | Freq: Two times a day (BID) | ORAL | 0 refills | Status: DC

## 2016-09-29 MED ORDER — PENICILLIN V POTASSIUM 250 MG PO TABS
500.0000 mg | ORAL_TABLET | Freq: Once | ORAL | Status: AC
  Administered 2016-09-29: 500 mg via ORAL
  Filled 2016-09-29: qty 2

## 2016-09-29 MED ORDER — IBUPROFEN 400 MG PO TABS
600.0000 mg | ORAL_TABLET | Freq: Once | ORAL | Status: AC
  Administered 2016-09-29: 600 mg via ORAL
  Filled 2016-09-29: qty 1

## 2016-09-29 MED ORDER — PENICILLIN V POTASSIUM 500 MG PO TABS: 500.0000 mg | ORAL_TABLET | Freq: Four times a day (QID) | ORAL | 0 refills | Status: AC

## 2016-09-29 NOTE — ED Triage Notes (Signed)
Pt presents to the ed with complaints of pain on the left side of her mouth x 5 months, this morning she woke up with a knot on the left top side of her gum and her pain is worse.

## 2016-09-29 NOTE — ED Provider Notes (Signed)
[facility] Emergency Department Provider Note  ED Clinical Impression   Pain, dental - Plan: DG Orthopantogram, DG Orthopantogram   History   Chief Complaint Dental Problem  HPI  Patient is a 31 y.o. female with a PMH of anxiety and depression who presents to ED for left upper dental pain, states that she noticed "a bump to my gum" yesterday, painful to touch, with associated cold sensitivity and mild sore throat, has tried Tylenol without significant relief. No recent dental work. Has a dentist, but was unable to get an appointment today. Denies fevers, chills, unexplained weight loss, dizziness, vision or gait changes, sudden or severe headaches, neck pain, facial/tongue/lip swelling, voice changes, dysphagia, difficulty handling oral secretions, nasal congestion, ear pain, CP, SOB, cough, abd pain, n/v/d, extremity pain, rash, or any additional concerns. No known sick contacts.   Past Medical History:  Diagnosis Date  . Abnormal Pap smear   . Anxiety   . Chlamydia   . Depression    No meds    Past Surgical History:  Procedure Laterality Date  . TUBAL LIGATION Bilateral 06/25/2012   Procedure: POST PARTUM TUBAL LIGATION;  Surgeon: Reva Bores, MD;  Location: [facility];  Service: Gynecology;  Laterality: Bilateral;  with filshie clips    Current Outpatient Rx  . Order #: [id number] Class: Print  . Order #: [id number] Class: Print  . Order #: [id number] Class: Print    Allergies Patient has no known allergies.  No family history on file.  Social History Social History  Substance Use Topics  . Smoking status: Former Smoker    Quit date: 01/24/2012  . Smokeless tobacco: Never Used  . Alcohol use No    Review of Systems  Constitutional: Negative for fever, chills, or unexplained weight loss. Eyes: Negative for visual changes. ENT: +sore throat. Negative for nasal congestion, ear pain, tinnitus, or facial/tongue/lip swelling  Cardiovascular: Negative for chest pain,  palpitations, or extremity swelling. Respiratory: Negative for shortness of breath or cough. Gastrointestinal: Negative for abdominal pain, nausea, vomiting, or diarrhea. Musculoskeletal: Negative for back pain or extremity pain/swelling. Skin: Negative for rash. Neurological: Negative for headaches, dizziness, focal weakness, or numbness/tingling.  Physical Exam   VITAL SIGNS:   ED Triage Vitals [09/29/16 0812]  Enc Vitals Group     BP 125/67     Pulse Rate 73     Resp 16     Temp 98.5 F (36.9 C)     Temp Source Oral     SpO2 100 %     Weight 182 lb (82.6 kg)     Height  (1.651 m)     Head Circumference      Peak Flow      Pain Score      Pain Loc      Pain Edu?      Excl. in GC?     Constitutional: Alert and oriented. Well appearing and in no respiratory apparent distress. Eyes: PERRL, EOMI, Conjunctivae normal ENT      Head: Normocephalic and atraumatic.      Ears: TM intact bilaterally without erythema or effusion, no hemotympanum, external ear canals normal.       Nose: No congestion.      Mouth/Throat: +dental carie noted to left 1st molar, no abscess or drainage, floor of mouth soft, no trismus. Mucous membranes are moist. Oropharynx without erythema or exudate. No trismus. No PTA noted. Normal voice, handling secretions normally. No TMJ click or pain with open & closing  mouth.       Neck: Supple, no nuchal signs, full active ROM of neck.  Hematological/Lymphatic/Immunological: No cervical lymphadenopathy. Cardiovascular: Normal S1 S2, regular rhythm, normal rate. Normal and symmetric distal pulses are present in all extremities. Respiratory: Breath sounds clear and equal bilaterally. No wheezes, rales, or rhonchi. Normal respiratory effort.  Gastrointestinal: Abdomen soft and nontender. No rebound or guarding.  Back: No midline tenderness.  Musculoskeletal: Spontaneously moves all extremities.  Neurologic: Speech clear. Alert and appropriate, no gross focal  neurologic deficits are appreciated. Gait steady with ambulation. Equal strength in all four extremities. Extremities neurovascularly intact.  Skin: Skin is warm, dry, and intact. No rash noted. Psychiatric: Mood and affect are normal. Speech and behavior are normal.  Labs   Labs Reviewed - No data to display  Radiology   No orders to display     ED Course, Assessment and Plan   Pt is a 31 y/o F, afebrile, who presents to ED for dental pain. No facial swelling/redness. No obvious collection to drain. No evidence of facial abscess, cellulitis of the face or anterior neck at this time. Doubt retropharyngeal abscess, peritonsillar abscess, submandibular space infection (Ludwig's angina), or necrotizing soft tissue infection at this time. No threat of airway. Will get rapid strep, and xray for dental abscess. Likely dc with Pen VK and symptomatic tx; follow up with PCP and dentist; pt amenable to this plan  11:41 AM Rapid strep negative. Xray with dental decay teeth 1, 14,16. Question early periapical cyst formation at tooth 14. Will plan to treat as dental abcess. Discussed results, discharge instructions, rx and safety, return precautions, and follow up with pt. Pt verbalizes understanding using verbal teachback and agrees with plan, denies any additional concerns.   Previous chart, nursing notes, and vital signs reviewed.    Pertinent labs & imaging results that were available during my care of the patient were reviewed by me and considered in my medical decision making (see chart for details).    Kesia Dalto, [place], NP 10/04/16 1623    Azalia Bilis, MD 10/07/16 [id number]

## 2016-09-29 NOTE — Discharge Instructions (Addendum)
Please take antibiotic, penicillin VK, as prescribed for full course--take with food to prevent GI upset. Take Naproxen (anti-inflammatory) as prescribed as needed for pain--take with food to prevent GI upset. Continue to stay hydrated. Call to schedule a follow up appointment with your primary care doctor in 3 days; also please call to schedule a follow up appointment with your dentist. Return to the ER if you experience fevers, chills, unexplained weight loss, vision or gait changes, sudden or severe headaches, neck pain, facial/tongue/lip swelling, pain with swallowing, difficulty handling oral secretions, voice changes, chest pain, shortness of breath, cough, abdominal pain, nausea, vomiting, diarrhea, extremity numbness/tingling/weakness, worsening symptoms, or any additional concerns.

## 2016-10-01 LAB — CULTURE, GROUP A STREP (THRC)

## 2016-10-19 ENCOUNTER — Encounter (HOSPITAL_COMMUNITY): Payer: Self-pay | Admitting: Emergency Medicine

## 2016-10-19 ENCOUNTER — Emergency Department (HOSPITAL_COMMUNITY)
Admission: EM | Admit: 2016-10-19 | Discharge: 2016-10-19 | Disposition: A | Discharge status: Home / Self Care | Payer: Managed Care, Other (non HMO) | Attending: Emergency Medicine | Admitting: Emergency Medicine

## 2016-10-19 DIAGNOSIS — R1030 Lower abdominal pain, unspecified: Secondary | ICD-10-CM

## 2016-10-19 DIAGNOSIS — Z79899 Other long term (current) drug therapy: Secondary | ICD-10-CM | POA: Insufficient documentation

## 2016-10-19 DIAGNOSIS — Z87891 Personal history of nicotine dependence: Secondary | ICD-10-CM | POA: Insufficient documentation

## 2016-10-19 DIAGNOSIS — N3000 Acute cystitis without hematuria: Secondary | ICD-10-CM | POA: Insufficient documentation

## 2016-10-19 DIAGNOSIS — A5901 Trichomonal vulvovaginitis: Secondary | ICD-10-CM | POA: Insufficient documentation

## 2016-10-19 LAB — COMPREHENSIVE METABOLIC PANEL
ALT: 9 U/L — ABNORMAL LOW (ref 14–54)
AST: 14 U/L — ABNORMAL LOW (ref 15–41)
Albumin: 3.6 g/dL (ref 3.5–5.0)
Alkaline Phosphatase: 52 U/L (ref 38–126)
Anion gap: 6 (ref 5–15)
BUN: 10 mg/dL (ref 6–20)
CO2: 23 mmol/L (ref 22–32)
Calcium: 8.7 mg/dL — ABNORMAL LOW (ref 8.9–10.3)
Chloride: 108 mmol/L (ref 101–111)
Creatinine, Ser: 0.91 mg/dL (ref 0.44–1.00)
GFR calc Af Amer: >60 mL/min (ref >60)
GFR calc non Af Amer: >60 mL/min (ref >60)
Glucose, Bld: 100 mg/dL — ABNORMAL HIGH (ref 65–99)
Potassium: 3.4 mmol/L — ABNORMAL LOW (ref 3.5–5.1)
Sodium: 137 mmol/L (ref 135–145)
Total Bilirubin: 0.4 mg/dL (ref 0.3–1.2)
Total Protein: 7.1 g/dL (ref 6.5–8.1)

## 2016-10-19 LAB — CBC
HCT: 39.4 % (ref 36.0–46.0)
Hemoglobin: 12.7 g/dL (ref 12.0–15.0)
MCH: 29.8 pg (ref 26.0–34.0)
MCHC: 32.2 g/dL (ref 30.0–36.0)
MCV: 92.5 fL (ref 78.0–100.0)
Platelets: 292 10*3/uL (ref 150–400)
RBC: 4.26 MIL/uL (ref 3.87–5.11)
RDW: 13.6 % (ref 11.5–15.5)
WBC: 8.6 10*3/uL (ref 4.0–10.5)

## 2016-10-19 LAB — WET PREP, GENITAL
Sperm: NONE SEEN
Yeast Wet Prep HPF POC: NONE SEEN

## 2016-10-19 LAB — URINALYSIS, ROUTINE W REFLEX MICROSCOPIC
Bilirubin Urine: NEGATIVE
Glucose, UA: NEGATIVE mg/dL
Ketones, ur: NEGATIVE mg/dL
Nitrite: NEGATIVE
Protein, ur: NEGATIVE mg/dL
Specific Gravity, Urine: 1.026 (ref 1.005–1.030)
pH: 5 (ref 5.0–8.0)

## 2016-10-19 LAB — I-STAT BETA HCG BLOOD, ED (MC, WL, AP ONLY): I-stat hCG, quantitative: <5 m[IU]/mL (ref <5)

## 2016-10-19 LAB — LIPASE, BLOOD: Lipase: 42 U/L (ref 11–51)

## 2016-10-19 MED ORDER — CEPHALEXIN 500 MG PO CAPS: 500.0000 mg | ORAL_CAPSULE | Freq: Three times a day (TID) | ORAL | 0 refills | Status: DC

## 2016-10-19 MED ORDER — ONDANSETRON 4 MG PO TBDP
4.0000 mg | ORAL_TABLET | Freq: Once | ORAL | Status: AC
  Administered 2016-10-19: 4 mg via ORAL
  Filled 2016-10-19: qty 1

## 2016-10-19 MED ORDER — AZITHROMYCIN 250 MG PO TABS
1000.0000 mg | ORAL_TABLET | Freq: Once | ORAL | Status: AC
  Administered 2016-10-19: 1000 mg via ORAL
  Filled 2016-10-19: qty 4

## 2016-10-19 MED ORDER — OXYCODONE-ACETAMINOPHEN 5-325 MG PO TABS
1.0000 | ORAL_TABLET | Freq: Once | ORAL | Status: AC
Start: 2016-10-19 — End: 2016-10-19
  Administered 2016-10-19: 1 via ORAL
  Filled 2016-10-19: qty 1

## 2016-10-19 MED ORDER — FLUCONAZOLE 200 MG PO TABS: 200.0000 mg | ORAL_TABLET | Freq: Once | ORAL | 0 refills | Status: AC

## 2016-10-19 MED ORDER — METRONIDAZOLE 500 MG PO TABS: 500.0000 mg | ORAL_TABLET | Freq: Two times a day (BID) | ORAL | 0 refills | Status: DC

## 2016-10-19 MED ORDER — CEFTRIAXONE SODIUM 250 MG IJ SOLR
250.0000 mg | Freq: Once | INTRAMUSCULAR | Status: AC
  Administered 2016-10-19: 250 mg via INTRAMUSCULAR
  Filled 2016-10-19: qty 250

## 2016-10-19 NOTE — ED Triage Notes (Signed)
Pt c/o LLQ pain that radiates to the back x 2 days. Denies nausea/vomiting. C/o frequent urination, denies pain with urination, no vaginal discharge.

## 2016-10-19 NOTE — ED Notes (Signed)
ED Provider at bedside. 

## 2016-10-19 NOTE — ED Notes (Signed)
Pt ambulated to room from waiting area, with a bag of crackers. Pt had no complaints while ambulating.

## 2016-10-19 NOTE — ED Notes (Signed)
Pelvic cart at bedside. 

## 2016-10-20 LAB — URINE CULTURE

## 2016-10-20 LAB — GC/CHLAMYDIA PROBE AMP (~~LOC~~) NOT AT ARMC
Chlamydia: NEGATIVE
Neisseria Gonorrhea: POSITIVE — AB

## 2016-10-25 NOTE — ED Provider Notes (Signed)
[name] EMERGENCY DEPARTMENT Provider Note   CSN: [id number] Arrival date & time: 10/19/16  0407     History   Chief Complaint Chief Complaint  Patient presents with  . Abdominal Pain    HPI Abigail Mays is a 31 y.o. female.  HPI   31 year old female with lower Donald pain. Onset about 2 days ago. Somewhat worse on the left side with radiation to her back. Nausea and vomiting. Increased urinary frequency. No dizziness.. Mild vaginal discharge. Is sexually active.  Past Medical History:  Diagnosis Date  . Abnormal Pap smear   . Anxiety   . Chlamydia   . Depression    No meds    Patient Active Problem List   Diagnosis Date Noted  . Sterilization 06/25/2012  . Normal delivery 06/25/2012  . Insufficient prenatal care 05/30/2012  . Depressive disorder, not elsewhere classified 05/30/2012    Past Surgical History:  Procedure Laterality Date  . TUBAL LIGATION Bilateral 06/25/2012   Procedure: POST PARTUM TUBAL LIGATION;  Surgeon: Reva Bores, MD;  Location: [facility];  Service: Gynecology;  Laterality: Bilateral;  with filshie clips    OB History    Gravida Para Term Preterm AB Living   4 4 3 1   4    SAB TAB Ectopic Multiple Live Births           1       Home Medications    Prior to Admission medications   Medication Sig Start Date End Date Taking? Authorizing Provider  acetaminophen (TYLENOL) 500 MG tablet Take 1,000 mg by mouth every 6 (six) hours as needed for mild pain.   Yes [provider]  ibuprofen (ADVIL,MOTRIN) 200 MG tablet Take 400 mg by mouth every 6 (six) hours as needed.   Yes [provider]  Multiple Vitamins-Minerals (MULTIVITAMIN WITH MINERALS) tablet Take 1 tablet by mouth daily.   Yes [provider]  cephALEXin (KEFLEX) 500 MG capsule Take 1 capsule (500 mg total) by mouth 3 (three) times daily. 10/19/16   Raeford Razor, MD  cyclobenzaprine (FLEXERIL) 10 MG tablet Take 1 tablet (10  mg total) by mouth 2 (two) times daily as needed for muscle spasms. Patient not taking: Reported on 10/19/2016 07/29/16   Barrett Henle, PA-C  HYDROcodone-acetaminophen (NORCO/VICODIN) 5-325 MG tablet Take 1 tablet by mouth every 4 (four) hours as needed. Patient not taking: Reported on 10/17/2015 02/28/15   Garlon Hatchet, PA-C  metroNIDAZOLE (FLAGYL) 500 MG tablet Take 1 tablet (500 mg total) by mouth 2 (two) times daily. 10/19/16   Raeford Razor, MD  naproxen (NAPROSYN) 500 MG tablet Take 1 tablet (500 mg total) by mouth 2 (two) times daily with a meal. Patient not taking: Reported on 10/19/2016 09/29/16   Wojeck, Hinton Dyer, NP  polyethylene glycol (MIRALAX / GLYCOLAX) packet Take 17 g by mouth daily. Patient not taking: Reported on 10/19/2016 10/17/15   Joycie Peek, PA-C    Family History No family history on file.  Social History Social History  Substance Use Topics  . Smoking status: Former Smoker    Quit date: 01/24/2012  . Smokeless tobacco: Never Used  . Alcohol use No     Allergies   Patient has no known allergies.   Review of Systems Review of Systems  All systems reviewed and negative, other than as noted in HPI.  Physical Exam Updated Vital Signs BP 111/76 (BP Location: Right Arm)   Pulse 70   Temp  98.4 F (36.9 C) (Oral)   Resp 16   Ht 5\' 5"  (1.651 m)   Wt 82.6 kg (182 lb)   LMP 10/15/2016   SpO2 100%   BMI 30.29 kg/m   Physical Exam  Constitutional: She appears well-developed and well-nourished. No distress.  HENT:  Head: Normocephalic and atraumatic.  Eyes: Conjunctivae are normal. Right eye exhibits no discharge. Left eye exhibits no discharge.  Neck: Neck supple.  Cardiovascular: Normal rate, regular rhythm and normal heart sounds.  Exam reveals no gallop and no friction rub.   No murmur heard. Pulmonary/Chest: Effort normal and breath sounds normal. No respiratory distress.  Abdominal: Soft. She exhibits no distension. There is no  tenderness.  Genitourinary:  Genitourinary Comments: Chaperone present. No external lesions noted. Moderate to large amount of grayish white vaginal discharge. Strong odor. Cervix somewhat friable in appearance. CMT.  Musculoskeletal: She exhibits no edema or tenderness.  Neurological: She is alert.  Skin: Skin is warm and dry.  Psychiatric: She has a normal mood and affect. Her behavior is normal. Thought content normal.  Nursing note and vitals reviewed.    ED Treatments / Results  Labs (all labs ordered are listed, but only abnormal results are displayed) Labs Reviewed  WET PREP, GENITAL - Abnormal; Notable for the following:       Result Value   Trich, Wet Prep PRESENT (*)    Clue Cells Wet Prep HPF POC PRESENT (*)    WBC, Wet Prep HPF POC MANY (*)    All other components within normal limits  URINE CULTURE - Abnormal; Notable for the following:    Culture MULTIPLE SPECIES PRESENT, SUGGEST RECOLLECTION (*)    All other components within normal limits  COMPREHENSIVE METABOLIC PANEL - Abnormal; Notable for the following:    Potassium 3.4 (*)    Glucose, Bld 100 (*)    Calcium 8.7 (*)    AST 14 (*)    ALT 9 (*)    All other components within normal limits  URINALYSIS, ROUTINE W REFLEX MICROSCOPIC - Abnormal; Notable for the following:    APPearance HAZY (*)    Hgb urine dipstick MODERATE (*)    Leukocytes, UA LARGE (*)    Bacteria, UA RARE (*)    Squamous Epithelial / LPF 6-30 (*)    All other components within normal limits  GC/CHLAMYDIA PROBE AMP ([place]) NOT AT [facility] - Abnormal; Notable for the following:    Neisseria gonorrhea **POSITIVE** (*)    All other components within normal limits  LIPASE, BLOOD  CBC  I-STAT BETA HCG BLOOD, ED ([facility], [facility], [facility] ONLY)    EKG  EKG Interpretation None       Radiology No results found.  Procedures Procedures (including critical care time)  Medications Ordered in ED Medications  oxyCODONE-acetaminophen  (PERCOCET/ROXICET) 5-325 MG per tablet 1 tablet (1 tablet Oral Given 10/19/16 0913)  cefTRIAXone (ROCEPHIN) injection 250 mg (250 mg Intramuscular Given 10/19/16 0919)  azithromycin (ZITHROMAX) tablet 1,000 mg (1,000 mg Oral Given 10/19/16 0913)  ondansetron (ZOFRAN-ODT) disintegrating tablet 4 mg (4 mg Oral Given 10/19/16 1012)     Initial Impression / Assessment and Plan / ED Course  I have reviewed the triage vital signs and the nursing notes.  Pertinent labs & imaging results that were available during my care of the patient were reviewed by me and considered in my medical decision making (see chart for details).     31 year old female with lower abdominal pain. Workup  syndrome or trichomonas and UTI and bacterial vaginosis.. Empirically treated for GC. Return precautions discussed. Outpatient follow-up otherwise. Advised that needs to have sexual partners identified and tested and to refrain from sexual contact until she has completed treatment.  Final Clinical Impressions(s) / ED Diagnoses   Final diagnoses:  Lower abdominal pain  Trichomonal vaginitis  Acute cystitis without hematuria    New Prescriptions Discharge Medication List as of 10/19/2016 10:03 AM    START taking these medications   Details  cephALEXin (KEFLEX) 500 MG capsule Take 1 capsule (500 mg total) by mouth 3 (three) times daily., Starting Thu 10/19/2016, Print    fluconazole (DIFLUCAN) 200 MG tablet Take 1 tablet (200 mg total) by mouth once. Take once dose the day you finish the other antibiotics., Starting Thu 10/19/2016, Print    metroNIDAZOLE (FLAGYL) 500 MG tablet Take 1 tablet (500 mg total) by mouth 2 (two) times daily., Starting Thu 10/19/2016, Print         Raeford Razor, MD 10/25/16 1022

## 2017-02-16 ENCOUNTER — Encounter (HOSPITAL_COMMUNITY): Payer: Self-pay | Admitting: *Deleted

## 2017-02-16 DIAGNOSIS — R6883 Chills (without fever): Secondary | ICD-10-CM | POA: Diagnosis not present

## 2017-02-16 DIAGNOSIS — R05 Cough: Secondary | ICD-10-CM | POA: Insufficient documentation

## 2017-02-16 DIAGNOSIS — R110 Nausea: Secondary | ICD-10-CM | POA: Diagnosis not present

## 2017-02-16 DIAGNOSIS — Z5321 Procedure and treatment not carried out due to patient leaving prior to being seen by health care provider: Secondary | ICD-10-CM | POA: Insufficient documentation

## 2017-02-16 DIAGNOSIS — J029 Acute pharyngitis, unspecified: Secondary | ICD-10-CM | POA: Diagnosis not present

## 2017-02-16 NOTE — ED Triage Notes (Signed)
Pt reports 2 days cough, sore throat, chills/sweats., and fever. She has also had some nasuea. Has been taking theraflu. Pt reports her two kids have been dx with the Flu. Mask placed in triage.

## 2017-02-17 ENCOUNTER — Emergency Department (HOSPITAL_COMMUNITY)
Admission: EM | Admit: 2017-02-17 | Discharge: 2017-02-17 | Disposition: A | Discharge status: Home / Self Care | Payer: Medicaid Other | Attending: Emergency Medicine | Admitting: Emergency Medicine

## 2017-02-17 NOTE — ED Notes (Signed)
Pt called from the lobby with no response 

## 2017-02-17 NOTE — ED Triage Notes (Signed)
No response in waiting are to take to treatment room

## 2017-02-17 NOTE — ED Notes (Signed)
No response in lobby to take to treatment room  

## 2017-02-21 ENCOUNTER — Other Ambulatory Visit: Payer: Self-pay

## 2017-02-21 ENCOUNTER — Ambulatory Visit (HOSPITAL_COMMUNITY)
Admission: EM | Admit: 2017-02-21 | Discharge: 2017-02-21 | Disposition: A | Discharge status: Home / Self Care | Payer: Medicaid Other | Attending: Family Medicine | Admitting: Family Medicine

## 2017-02-21 ENCOUNTER — Encounter (HOSPITAL_COMMUNITY): Payer: Self-pay | Admitting: Family Medicine

## 2017-02-21 DIAGNOSIS — J40 Bronchitis, not specified as acute or chronic: Secondary | ICD-10-CM

## 2017-02-21 MED ORDER — AZITHROMYCIN 250 MG PO TABS: 250.0000 mg | ORAL_TABLET | Freq: Every day | ORAL | 0 refills | Status: DC

## 2017-02-21 MED ORDER — HYDROCODONE-HOMATROPINE 5-1.5 MG/5ML PO SYRP: 5.0000 mL | ORAL_SOLUTION | Freq: Four times a day (QID) | ORAL | 0 refills | Status: DC | PRN

## 2017-02-21 NOTE — ED Provider Notes (Signed)
[facility]   [id number] 02/21/17 Arrival Time: 1009   SUBJECTIVE:  Abigail Mays is a 32 y.o. female who presents to the urgent care with complaint of upper respiratory symptoms for 7 days with worsening, painful cough.  Works in [employer] copy     Past Medical History:  Diagnosis Date  . Abnormal Pap smear   . Anxiety   . Chlamydia   . Depression    No meds   Family History  Problem Relation Age of Onset  . Healthy Mother    Social History   Socioeconomic History  . Marital status: Single    Spouse name: Not on file  . Number of children: Not on file  . Years of education: Not on file  . Highest education level: Not on file  Social Needs  . Financial resource strain: Not on file  . Food insecurity - worry: Not on file  . Food insecurity - inability: Not on file  . Transportation needs - medical: Not on file  . Transportation needs - non-medical: Not on file  Occupational History  . Not on file  Tobacco Use  . Smoking status: Former Smoker    Last attempt to quit: 01/24/2012    Years since quitting: 5.0  . Smokeless tobacco: Never Used  Substance and Sexual Activity  . Alcohol use: Yes  . Drug use: No    Comment: Stopped smoking marijuana  . Sexual activity: Yes    Birth control/protection: None  Other Topics Concern  . Not on file  Social History Narrative  . Not on file   Current Meds  Medication Sig  . NON FORMULARY    No Known Allergies    ROS: As per HPI, remainder of ROS negative.   OBJECTIVE:   Vitals:   02/21/17 1111  BP: 126/76  Pulse: 84  Resp: (!) 22  Temp: 99.4 F (37.4 C)  TempSrc: Oral  SpO2: 100%     General appearance: alert; no distress Eyes: PERRL; EOMI; conjunctiva normal HENT: normocephalic; atraumatic; TMs normal, canal normal, external ears normal without trauma; nasal mucosa normal; oral mucosa normal Neck: supple Lungs: ronchi bilaterally Heart: regular rate and rhythm Back:  no CVA tenderness Extremities: no cyanosis or edema; symmetrical with no gross deformities Skin: warm and dry Neurologic: normal gait; grossly normal Psychological: alert and cooperative; normal mood and affect      Labs:  Results for orders placed or performed during the hospital encounter of 10/19/16  Wet prep, genital  Result Value Ref Range   Yeast Wet Prep HPF POC NONE SEEN NONE SEEN   Trich, Wet Prep PRESENT (A) NONE SEEN   Clue Cells Wet Prep HPF POC PRESENT (A) NONE SEEN   WBC, Wet Prep HPF POC MANY (A) NONE SEEN   Sperm NONE SEEN   Urine culture  Result Value Ref Range   Specimen Description URINE, RANDOM    Special Requests NONE    Culture MULTIPLE SPECIES PRESENT, SUGGEST RECOLLECTION (A)    Report Status 10/20/2016 FINAL   Lipase, blood  Result Value Ref Range   Lipase 42 11 - 51 U/L  Comprehensive metabolic panel  Result Value Ref Range   Sodium 137 135 - 145 mmol/L   Potassium 3.4 (L) 3.5 - 5.1 mmol/L   Chloride 108 101 - 111 mmol/L   CO2 23 22 - 32 mmol/L   Glucose, Bld 100 (H) 65 - 99 mg/dL   BUN 10 6 - 20 mg/dL  Creatinine, Ser 0.91 0.44 - 1.00 mg/dL   Calcium 8.7 (L) 8.9 - 10.3 mg/dL   Total Protein 7.1 6.5 - 8.1 g/dL   Albumin 3.6 3.5 - 5.0 g/dL   AST 14 (L) 15 - 41 U/L   ALT 9 (L) 14 - 54 U/L   Alkaline Phosphatase 52 38 - 126 U/L   Total Bilirubin 0.4 0.3 - 1.2 mg/dL   GFR calc non Af Amer >60 >60 mL/min   GFR calc Af Amer >60 >60 mL/min   Anion gap 6 5 - 15  CBC  Result Value Ref Range   WBC 8.6 4.0 - 10.5 K/uL   RBC 4.26 3.87 - 5.11 MIL/uL   Hemoglobin 12.7 12.0 - 15.0 g/dL   HCT 14.739.4 82.936.0 - 56.246.0 %   MCV 92.5 78.0 - 100.0 fL   MCH 29.8 26.0 - 34.0 pg   MCHC 32.2 30.0 - 36.0 g/dL   RDW 13.013.6 86.511.5 - 78.415.5 %   Platelets 292 150 - 400 K/uL  Urinalysis, Routine w reflex microscopic  Result Value Ref Range   Color, Urine YELLOW YELLOW   APPearance HAZY (A) CLEAR   Specific Gravity, Urine 1.026 1.005 - 1.030   pH 5.0 5.0 - 8.0   Glucose,  UA NEGATIVE NEGATIVE mg/dL   Hgb urine dipstick MODERATE (A) NEGATIVE   Bilirubin Urine NEGATIVE NEGATIVE   Ketones, ur NEGATIVE NEGATIVE mg/dL   Protein, ur NEGATIVE NEGATIVE mg/dL   Nitrite NEGATIVE NEGATIVE   Leukocytes, UA LARGE (A) NEGATIVE   RBC / HPF 6-30 0 - 5 RBC/hpf   WBC, UA 6-30 0 - 5 WBC/hpf   Bacteria, UA RARE (A) NONE SEEN   Squamous Epithelial / LPF 6-30 (A) NONE SEEN   Mucus PRESENT   I-Stat beta hCG blood, ED  Result Value Ref Range   I-stat hCG, quantitative <5.0 <5 mIU/mL   Comment 3          GC/Chlamydia probe amp ([facility])not [facility]  Result Value Ref Range   Chlamydia Negative    Neisseria gonorrhea **POSITIVE** (A)     Labs Reviewed - No data to display  No results found.     ASSESSMENT & PLAN:  1. Bronchitis     Meds ordered this encounter  Medications  . HYDROcodone-homatropine (HYDROMET) 5-1.5 MG/5ML syrup    Sig: Take 5 mLs by mouth every 6 (six) hours as needed for cough.    Dispense:  60 mL    Refill:  0  . azithromycin (ZITHROMAX) 250 MG tablet    Sig: Take 1 tablet (250 mg total) by mouth daily. Take first 2 tablets together, then 1 every day until finished.    Dispense:  6 tablet    Refill:  0    Reviewed expectations re: course of current medical issues. Questions answered. Outlined signs and symptoms indicating need for more acute intervention. Patient verbalized understanding. After Visit Summary given.       Elvina SidleLauenstein, Julie Paolini, MD 02/21/17 1116

## 2017-02-21 NOTE — ED Triage Notes (Signed)
Onset of symptoms last week with a cold and sore throat.  Friday symptoms worsened with hot and cold chills, chest congestion and soreness, everything hurts with cough.

## 2017-03-13 ENCOUNTER — Emergency Department (HOSPITAL_COMMUNITY)
Admission: EM | Admit: 2017-03-13 | Discharge: 2017-03-13 | Disposition: A | Discharge status: Home / Self Care | Payer: Medicaid Other | Attending: Emergency Medicine | Admitting: Emergency Medicine

## 2017-03-13 ENCOUNTER — Encounter (HOSPITAL_COMMUNITY): Payer: Self-pay

## 2017-03-13 DIAGNOSIS — K0889 Other specified disorders of teeth and supporting structures: Secondary | ICD-10-CM | POA: Diagnosis present

## 2017-03-13 DIAGNOSIS — Z87891 Personal history of nicotine dependence: Secondary | ICD-10-CM | POA: Diagnosis not present

## 2017-03-13 DIAGNOSIS — K029 Dental caries, unspecified: Secondary | ICD-10-CM | POA: Diagnosis not present

## 2017-03-13 MED ORDER — HYDROCODONE-ACETAMINOPHEN 5-325 MG PO TABS: 1.0000 | ORAL_TABLET | ORAL | 0 refills | Status: DC | PRN

## 2017-03-13 MED ORDER — MELOXICAM 15 MG PO TABS: 15.0000 mg | ORAL_TABLET | Freq: Every day | ORAL | 0 refills | Status: DC

## 2017-03-13 MED ORDER — AMOXICILLIN 500 MG PO CAPS: 500.0000 mg | ORAL_CAPSULE | Freq: Three times a day (TID) | ORAL | 0 refills | Status: DC

## 2017-03-13 NOTE — ED Triage Notes (Signed)
Pt reports left upper tooth pain and swelling to gum area after cracking tooth. No fevers

## 2017-03-13 NOTE — Discharge Instructions (Addendum)
[employer]  °[employer]  °[facility]  °[address]  °[place],  [address]  °Phone [phone]  °[facility] [employer] [facility] [place], exemplifies the Dental School’s vision to improve the health and quality of life of all [place]ians by creating leaders with a passion to care for the underserved and by leading the nation in community-based, service learning oral health education. °We are committed to offering comprehensive general dental services for adults, children and special needs patients in a safe, caring and professional setting. ° °Appointments: Our clinic is open Monday through Friday 8:00 a.m. until 5:00 p.m. The amount of time scheduled for an appointment depends on the patient’s specific needs. We ask that you keep your appointed time for care or provide 24-hour notice of all appointment changes. Parents or legal guardians must accompany minor children. ° °Payment for Services: Medicaid and other insurance plans are welcome. Payment for services is due when services are rendered and may be made by cash or credit card. If you have dental insurance, we will assist you with your claim submission.  ° °Emergencies: Emergency services will be provided Monday through Friday on a walk-in basis. Please arrive early for emergency services. After hours emergency services will be provided for patients of record as required. ° °Services:  °Comprehensive General Dentistry  °Children’s Dentistry  °Oral Surgery - Extractions  °Root Canals  °Sealants and Tooth Colored Fillings  °Crowns and Bridges  °Dentures and Partial Dentures  °Implant Services  °Periodontal Services and Cleanings  °Cosmetic Tooth Whitening  °Digital Radiography  °3-D/[facility] Beam Imaging ° ° °

## 2017-03-13 NOTE — ED Provider Notes (Signed)
[name] EMERGENCY DEPARTMENT Provider Note   CSN: [id number] Arrival date & time: 03/13/17  0865     History   Chief Complaint Chief Complaint  Patient presents with  . Dental Pain    HPI Abigail Mays is a 32 y.o. female  Patient presents to the emergency department with a dental complaint. Symptoms began 2 days ago. The patient has tried to alleviate pain with tylenol .  Pain rated at a 10/10, characterized as throbbing in nature and located in the left . Patient denies fever, night sweats, chills, difficulty swallowing or opening mouth, SOB, nuchal rigidity or decreased ROM of neck.  Patient does not have a dentist and requests a resource guide at discharge.   HPI  Past Medical History:  Diagnosis Date  . Abnormal Pap smear   . Anxiety   . Chlamydia   . Depression    No meds    Patient Active Problem List   Diagnosis Date Noted  . Sterilization 06/25/2012  . Normal delivery 06/25/2012  . Insufficient prenatal care 05/30/2012  . Depressive disorder, not elsewhere classified 05/30/2012    Past Surgical History:  Procedure Laterality Date  . TUBAL LIGATION Bilateral 06/25/2012   Procedure: POST PARTUM TUBAL LIGATION;  Surgeon: Reva Bores, MD;  Location: [facility];  Service: Gynecology;  Laterality: Bilateral;  with filshie clips    OB History    Gravida Para Term Preterm AB Living   4 4 3 1   4    SAB TAB Ectopic Multiple Live Births           1       Home Medications    Prior to Admission medications   Medication Sig Start Date End Date Taking? Authorizing Provider  acetaminophen (TYLENOL) 500 MG tablet Take 1,000 mg by mouth every 6 (six) hours as needed for mild pain.    [provider]  azithromycin (ZITHROMAX) 250 MG tablet Take 1 tablet (250 mg total) by mouth daily. Take first 2 tablets together, then 1 every day until finished. 02/21/17   Elvina Sidle, MD  HYDROcodone-homatropine (HYDROMET) 5-1.5 MG/5ML syrup  Take 5 mLs by mouth every 6 (six) hours as needed for cough. 02/21/17   Elvina Sidle, MD  ibuprofen (ADVIL,MOTRIN) 200 MG tablet Take 400 mg by mouth every 6 (six) hours as needed.    [provider]  Multiple Vitamins-Minerals (MULTIVITAMIN WITH MINERALS) tablet Take 1 tablet by mouth daily.    [provider]  NON FORMULARY     [provider]    Family History Family History  Problem Relation Age of Onset  . Healthy Mother     Social History Social History   Tobacco Use  . Smoking status: Former Smoker    Last attempt to quit: 01/24/2012    Years since quitting: 5.1  . Smokeless tobacco: Never Used  Substance Use Topics  . Alcohol use: Yes  . Drug use: No    Comment: Stopped smoking marijuana     Allergies   Patient has no known allergies.   Review of Systems Review of Systems  Ten systems reviewed and are negative for acute change, except as noted in the HPI.   Physical Exam Updated Vital Signs BP 116/82   Pulse 70   Temp 98.4 F (36.9 C) (Oral)   Resp 20   SpO2 100%   Physical Exam  Constitutional: She is oriented to person, place, and time. She appears well-developed and  well-nourished. No distress.  Patient is tearful  HENT:  Head: Normocephalic and atraumatic.  The left upper first molar has a crack on the occlusal side posteriorly.  No overt abscess, tenderness along the apical ridge without swelling.  Uvula is midline, no oral pharyngeal swelling.  Eyes: Conjunctivae are normal. No scleral icterus.  Neck: Normal range of motion.  Cardiovascular: Normal rate, regular rhythm and normal heart sounds. Exam reveals no gallop and no friction rub.  No murmur heard. Pulmonary/Chest: Effort normal and breath sounds normal. No respiratory distress.  Abdominal: Soft. Bowel sounds are normal. She exhibits no distension and no mass. There is no tenderness. There is no guarding.  Neurological: She is alert and oriented to person,  place, and time.  Skin: Skin is warm and dry. She is not diaphoretic.  Psychiatric: Her behavior is normal.  Nursing note and vitals reviewed.    ED Treatments / Results  Labs (all labs ordered are listed, but only abnormal results are displayed) Labs Reviewed - No data to display  EKG  EKG Interpretation None       Radiology No results found.  Procedures Procedures (including critical care time)  Medications Ordered in ED Medications - No data to display   Initial Impression / Assessment and Plan / ED Course  I have reviewed the triage vital signs and the nursing notes.  Pertinent labs & imaging results that were available during my care of the patient were reviewed by me and considered in my medical decision making (see chart for details).     Patient with toothache.  No gross abscess.  Exam unconcerning for Ludwig's angina or spread of infection.  Will treat with penicillin and pain medicine.  Urged patient to follow-up with dentist.     Final Clinical Impressions(s) / ED Diagnoses   Final diagnoses:  Pain due to dental caries    ED Discharge Orders    None       Arthor CaptainHarris, Makensie Mulhall, PA-C 03/13/17 1106    Alvira MondaySchlossman, Erin, MD 03/15/17 1313

## 2017-03-25 ENCOUNTER — Emergency Department (HOSPITAL_COMMUNITY): Payer: Medicaid Other

## 2017-03-25 ENCOUNTER — Emergency Department (HOSPITAL_COMMUNITY)
Admission: EM | Admit: 2017-03-25 | Discharge: 2017-03-25 | Disposition: A | Discharge status: Home / Self Care | Payer: Medicaid Other | Attending: Emergency Medicine | Admitting: Emergency Medicine

## 2017-03-25 ENCOUNTER — Encounter (HOSPITAL_COMMUNITY): Payer: Self-pay

## 2017-03-25 DIAGNOSIS — Y929 Unspecified place or not applicable: Secondary | ICD-10-CM | POA: Insufficient documentation

## 2017-03-25 DIAGNOSIS — S92144A Nondisplaced dome fracture of right talus, initial encounter for closed fracture: Secondary | ICD-10-CM | POA: Diagnosis present

## 2017-03-25 DIAGNOSIS — W230XXA Caught, crushed, jammed, or pinched between moving objects, initial encounter: Secondary | ICD-10-CM | POA: Diagnosis not present

## 2017-03-25 DIAGNOSIS — Y999 Unspecified external cause status: Secondary | ICD-10-CM | POA: Diagnosis not present

## 2017-03-25 DIAGNOSIS — Z87891 Personal history of nicotine dependence: Secondary | ICD-10-CM | POA: Diagnosis not present

## 2017-03-25 DIAGNOSIS — Y939 Activity, unspecified: Secondary | ICD-10-CM | POA: Diagnosis not present

## 2017-03-25 DIAGNOSIS — Z79899 Other long term (current) drug therapy: Secondary | ICD-10-CM | POA: Diagnosis not present

## 2017-03-25 NOTE — ED Notes (Signed)
Ortho at bedside at this time.

## 2017-03-25 NOTE — ED Notes (Signed)
See EDP secondary assessment.  

## 2017-03-25 NOTE — Progress Notes (Signed)
Orthopedic Tech Progress Note Patient Details:  Abigail Mays Jul 02, 1985 [id number]  Ortho Devices Type of Ortho Device: Crutches, CAM walker Ortho Device/Splint Location: RLE Ortho Device/Splint Interventions: Ordered, Application, Adjustment   Post Interventions Patient Tolerated: Well   Jennye MoccasinHughes, Abigail Mays 03/25/2017, 3:34 PM

## 2017-03-25 NOTE — ED Provider Notes (Addendum)
[facility]   CSN: [id number] Arrival date & time: 03/25/17  1220     History   Chief Complaint Chief Complaint  Patient presents with  . Ankle Pain    HPI Abigail Mays is a 32 y.o. female.  HPI Abigail Mays is a 32 y.o. female presents to ED with complaint of right ankle pain.  Patient states that she dropped a metal bed frame on her right foot yesterday evening.  She states that this morning when she woke up she was unable to bear any weight on that foot.  Pain is worsened with movement of the ankle and wiggling her toes.  She reports swelling.  No bruising.  No other injuries.  No numbness or tingling distally.  No other complaints.  No treatment prior to coming in.  Past Medical History:  Diagnosis Date  . Abnormal Pap smear   . Anxiety   . Chlamydia   . Depression    No meds    Patient Active Problem List   Diagnosis Date Noted  . Sterilization 06/25/2012  . Normal delivery 06/25/2012  . Insufficient prenatal care 05/30/2012  . Depressive disorder, not elsewhere classified 05/30/2012    Past Surgical History:  Procedure Laterality Date  . TUBAL LIGATION Bilateral 06/25/2012   Procedure: POST PARTUM TUBAL LIGATION;  Surgeon: Abigail Boresanya S Pratt, MD;  Location: [facility];  Service: Gynecology;  Laterality: Bilateral;  with filshie clips    OB History    Gravida Para Term Preterm AB Living   4 4 3 1   4    SAB TAB Ectopic Multiple Live Births           1       Home Medications    Prior to Admission medications   Medication Sig Start Date End Date Taking? Authorizing Provider  acetaminophen (TYLENOL) 500 MG tablet Take 1,000 mg by mouth every 6 (six) hours as needed for mild pain.    [provider]  amoxicillin (AMOXIL) 500 MG capsule Take 1 capsule (500 mg total) by mouth 3 (three) times daily. 03/13/17   Harris, Abigail, PA-C  azithromycin (ZITHROMAX) 250 MG tablet Take 1 tablet (250 mg  total) by mouth daily. Take first 2 tablets together, then 1 every day until finished. 02/21/17   Elvina Mays, Kurt, MD  HYDROcodone-acetaminophen (NORCO) 5-325 MG tablet Take 1-2 tablets by mouth every 4 (four) hours as needed for severe pain. 03/13/17   Harris, Abigail, PA-C  HYDROcodone-homatropine (HYDROMET) 5-1.5 MG/5ML syrup Take 5 mLs by mouth every 6 (six) hours as needed for cough. 02/21/17   Elvina Mays, Kurt, MD  ibuprofen (ADVIL,MOTRIN) 200 MG tablet Take 400 mg by mouth every 6 (six) hours as needed.    [provider]  meloxicam (MOBIC) 15 MG tablet Take 1 tablet (15 mg total) by mouth daily. Take 1 daily with food. 03/13/17   Arthor Mays, Abigail, PA-C  Multiple Vitamins-Minerals (MULTIVITAMIN WITH MINERALS) tablet Take 1 tablet by mouth daily.    [provider]  NON FORMULARY     [provider]    Family History Family History  Problem Relation Age of Onset  . Healthy Mother     Social History Social History   Tobacco Use  . Smoking status: Former Smoker    Last attempt to quit: 01/24/2012    Years since quitting: 5.1  . Smokeless tobacco: Never Used  Substance Use Topics  . Alcohol use: Yes  .  Drug use: No    Comment: Stopped smoking marijuana     Allergies   Patient has no known allergies.   Review of Systems Review of Systems  Constitutional: Negative for chills and fever.  Cardiovascular: Negative for leg swelling.  Musculoskeletal: Positive for arthralgias and joint swelling. Negative for myalgias, neck pain and neck stiffness.  Skin: Negative for rash.  Neurological: Negative for weakness and numbness.  All other systems reviewed and are negative.    Physical Exam Updated Vital Signs BP 114/68 (BP Location: Right Arm)   Pulse 82   Temp 98.3 F (36.8 C) (Oral)   Resp 18   Ht 5\' 5"  (1.651 m)   Wt 85.7 kg (189 lb)   SpO2 100%   BMI 31.45 kg/m   Physical Exam  Constitutional: She appears well-developed and well-nourished. No  distress.  Eyes: Conjunctivae are normal.  Neck: Neck supple.  Musculoskeletal:       Feet:  Neurological: She is alert.  Swelling noted to the dorsal right foot.  No tenderness over medial lateral malleolus.  Achilles tendon is intact.  Tender to palpation over dorsal foot and dorsal ankle joint.  Normal toes.  Full range of motion of the toes.  Pain with dorsiflexion of the toes.  Pain with inversion and eversion of the ankle joint, however full range of motion.  No bruising or erythema.  Dorsal pedal pulse intact.  Skin: Skin is warm and dry.  Nursing note and vitals reviewed.    ED Treatments / Results  Labs (all labs ordered are listed, but only abnormal results are displayed) Labs Reviewed - No data to display  EKG  EKG Interpretation None       Radiology Dg Ankle Complete Right  Result Date: 03/25/2017 CLINICAL DATA:  Pain. Struck ankle on bed frame. Unable to ambulate. EXAM: RIGHT ANKLE - COMPLETE 3+ VIEW COMPARISON:  None. FINDINGS: There is no evidence of transverse fracture, dislocation, or joint effusion. There is slight irregularity on the lateral view, involving the anterior talus but this cannot be confirmed on the AP or oblique view. There is anterior soft tissue swelling. Anterior soft tissue swelling. IMPRESSION: Slight irregularity on the lateral view involving the anterior talus but this cannot be confirmed on the AP or oblique view. Soft tissue swelling. Consider repeat radiographs if pain persists at 7-10 days. Electronically Signed   By: Elsie Stain M.D.   On: 03/25/2017 13:41    Procedures Procedures (including critical care time)  Medications Ordered in ED Medications - No data to display   Initial Impression / Assessment and Plan / ED Course  I have reviewed the triage vital signs and the nursing notes.  Pertinent labs & imaging results that were available during my care of the patient were reviewed by me and considered in my medical decision  making (see chart for details).     Patient unable to ambulate due to right foot pain after dropping a metal frame on it.  On the x-ray, slight irregularly over anterior talus, directly over where patient's pain is.  Patient is otherwise neurovascularly intact.  Will place in a cam walker, crutches as needed for ambulation, follow-up with orthopedics.   Vitals:   03/25/17 1232  BP: 114/68  Pulse: 82  Resp: 18  Temp: 98.3 F (36.8 C)  TempSrc: Oral  SpO2: 100%  Weight: 85.7 kg (189 lb)  Height: 5\' 5"  (1.651 m)     Final Clinical Impressions(s) / ED Diagnoses  Final diagnoses:  Closed nondisplaced fracture of dome of right talus, initial encounter    ED Discharge Orders    None       Jaynie Crumble, PA-C 03/25/17 2200    Jaynie Crumble, PA-C 03/25/17 2201    Blane Ohara, MD 03/30/17 [phone]

## 2017-03-25 NOTE — Discharge Instructions (Signed)
Tylenol or motrin for pain. Keep leg elevated. Ice several times a day. Your xray showed possible fracture through a talus bone in your foot. Please follow up for repeat xray in 1 week with family doctor or orthopedics.

## 2017-03-25 NOTE — ED Notes (Signed)
Ortho paged. 

## 2017-03-25 NOTE — ED Triage Notes (Signed)
Pt reports hitting top of right ankle on bed frame yesterday and being unable to ambulate this morning

## 2017-05-07 ENCOUNTER — Encounter (HOSPITAL_COMMUNITY): Payer: Self-pay | Admitting: Emergency Medicine

## 2017-05-07 ENCOUNTER — Emergency Department (HOSPITAL_COMMUNITY)
Admission: EM | Admit: 2017-05-07 | Discharge: 2017-05-07 | Disposition: A | Discharge status: Home / Self Care | Payer: Medicaid Other | Attending: Emergency Medicine | Admitting: Emergency Medicine

## 2017-05-07 DIAGNOSIS — Z79899 Other long term (current) drug therapy: Secondary | ICD-10-CM | POA: Diagnosis not present

## 2017-05-07 DIAGNOSIS — Z87891 Personal history of nicotine dependence: Secondary | ICD-10-CM | POA: Diagnosis not present

## 2017-05-07 DIAGNOSIS — K0889 Other specified disorders of teeth and supporting structures: Secondary | ICD-10-CM | POA: Insufficient documentation

## 2017-05-07 DIAGNOSIS — K029 Dental caries, unspecified: Secondary | ICD-10-CM | POA: Insufficient documentation

## 2017-05-07 MED ORDER — NAPROXEN 500 MG PO TABS: 500.0000 mg | ORAL_TABLET | Freq: Two times a day (BID) | ORAL | 0 refills | Status: DC

## 2017-05-07 MED ORDER — AMOXICILLIN 500 MG PO CAPS
500.0000 mg | ORAL_CAPSULE | Freq: Once | ORAL | Status: AC
  Administered 2017-05-07: 500 mg via ORAL
  Filled 2017-05-07: qty 1

## 2017-05-07 MED ORDER — HYDROCODONE-ACETAMINOPHEN 5-325 MG PO TABS
1.0000 | ORAL_TABLET | Freq: Once | ORAL | Status: AC
  Administered 2017-05-07: 1 via ORAL
  Filled 2017-05-07: qty 1

## 2017-05-07 MED ORDER — AMOXICILLIN 500 MG PO CAPS: 500.0000 mg | ORAL_CAPSULE | Freq: Three times a day (TID) | ORAL | 0 refills | Status: DC

## 2017-05-07 NOTE — Discharge Instructions (Signed)
Call Dr. Kelly Splinter office tomorrow and tell them you were seen in the ED and need follow up.

## 2017-05-07 NOTE — ED Provider Notes (Signed)
[name] EMERGENCY DEPARTMENT Provider Note   CSN: [id number] Arrival date & time: 05/07/17  0153     History   Chief Complaint No chief complaint on file.   HPI Abigail Mays is a 32 y.o. female who presents to the ED with dental pain. The patient started yesterday and has gotten much worse today. The patient reports that the pain is in the right upper wisdom tooth. Patient denies fever, n/v or other problems. She does not have a dentist.   HPI  Past Medical History:  Diagnosis Date  . Abnormal Pap smear   . Anxiety   . Chlamydia   . Depression    No meds    Patient Active Problem List   Diagnosis Date Noted  . Sterilization 06/25/2012  . Normal delivery 06/25/2012  . Insufficient prenatal care 05/30/2012  . Depressive disorder, not elsewhere classified 05/30/2012    Past Surgical History:  Procedure Laterality Date  . TUBAL LIGATION Bilateral 06/25/2012   Procedure: POST PARTUM TUBAL LIGATION;  Surgeon: Reva Bores, MD;  Location: [facility];  Service: Gynecology;  Laterality: Bilateral;  with filshie clips     OB History    Gravida  4   Para  4   Term  3   Preterm  1   AB      Living  4     SAB      TAB      Ectopic      Multiple      Live Births  1            Home Medications    Prior to Admission medications   Medication Sig Start Date End Date Taking? Authorizing Provider  acetaminophen (TYLENOL) 500 MG tablet Take 1,000 mg by mouth every 6 (six) hours as needed for mild pain.    [provider]  amoxicillin (AMOXIL) 500 MG capsule Take 1 capsule (500 mg total) by mouth 3 (three) times daily. 05/07/17   Janne Napoleon, NP  azithromycin (ZITHROMAX) 250 MG tablet Take 1 tablet (250 mg total) by mouth daily. Take first 2 tablets together, then 1 every day until finished. 02/21/17   Elvina Sidle, MD  Multiple Vitamins-Minerals (MULTIVITAMIN WITH MINERALS) tablet Take 1 tablet by mouth daily.     [provider]  naproxen (NAPROSYN) 500 MG tablet Take 1 tablet (500 mg total) by mouth 2 (two) times daily. 05/07/17   Janne Napoleon, NP  NON FORMULARY     [provider]    Family History Family History  Problem Relation Age of Onset  . Healthy Mother     Social History Social History   Tobacco Use  . Smoking status: Former Smoker    Last attempt to quit: 01/24/2012    Years since quitting: 5.2  . Smokeless tobacco: Never Used  Substance Use Topics  . Alcohol use: Yes  . Drug use: No    Types: Marijuana    Comment: Stopped smoking marijuana     Allergies   Patient has no known allergies.   Review of Systems Review of Systems  HENT: Positive for dental problem. Negative for facial swelling, sore throat and trouble swallowing.   All other systems reviewed and are negative.    Physical Exam Updated Vital Signs BP 125/85 (BP Location: Right Arm)   Pulse 77   Temp 98.4 F (36.9 C) (Oral)   Resp 16   Ht  (1.651 m)  Wt 83.9 kg (185 lb)   SpO2 100%   BMI 30.79 kg/m   Physical Exam  Constitutional: She appears well-developed and well-nourished. No distress.  HENT:  Head: Normocephalic.  Nose: Nose normal.  Mouth/Throat: Oropharynx is clear and moist. Dental caries present.    Eyes: EOM are normal.  Neck: Neck supple.  Cardiovascular: Normal rate.  Pulmonary/Chest: Effort normal.  Musculoskeletal: Normal range of motion.  Lymphadenopathy:    She has no cervical adenopathy.  Neurological: She is alert.  Skin: Skin is warm and dry.  Psychiatric: She has a normal mood and affect.  Nursing note and vitals reviewed.    ED Treatments / Results  Labs (all labs ordered are listed, but only abnormal results are displayed) Labs Reviewed - No data to display  Radiology No results found.  Procedures Procedures (including critical care time)  Medications Ordered in ED Medications  HYDROcodone-acetaminophen (NORCO/VICODIN) 5-325  MG per tablet 1 tablet (has no administration in time range)  amoxicillin (AMOXIL) capsule 500 mg (has no administration in time range)     Initial Impression / Assessment and Plan / ED Course  I have reviewed the triage vital signs and the nursing notes.  Patient with toothache.  No gross abscess.  Exam unconcerning for Ludwig's angina or spread of infection.  Will treat with Amoxicillin and anti-inflammatories medicine.  Urged patient to follow-up with dentist, referral given.  Final Clinical Impressions(s) / ED Diagnoses   Final diagnoses:  Pain due to dental caries    ED Discharge Orders        Ordered    amoxicillin (AMOXIL) 500 MG capsule  3 times daily     05/07/17 0201    naproxen (NAPROSYN) 500 MG tablet  2 times daily     05/07/17 0201       Kerrie Buffalo [place], NP 05/07/17 1610    Zadie Rhine, MD 05/07/17 [phone]

## 2017-05-07 NOTE — ED Triage Notes (Signed)
Reports toothache on right upper side that started yesterday.

## 2019-11-13 IMAGING — CR DG ANKLE COMPLETE 3+V*R*
3 series · 3 of 3 positions shown · non-contrast
Comparison: None.

CLINICAL DATA: Pain. Struck ankle on bed frame. Unable to ambulate.

EXAM:
RIGHT ANKLE - COMPLETE 3+ VIEW

[ankle ap]
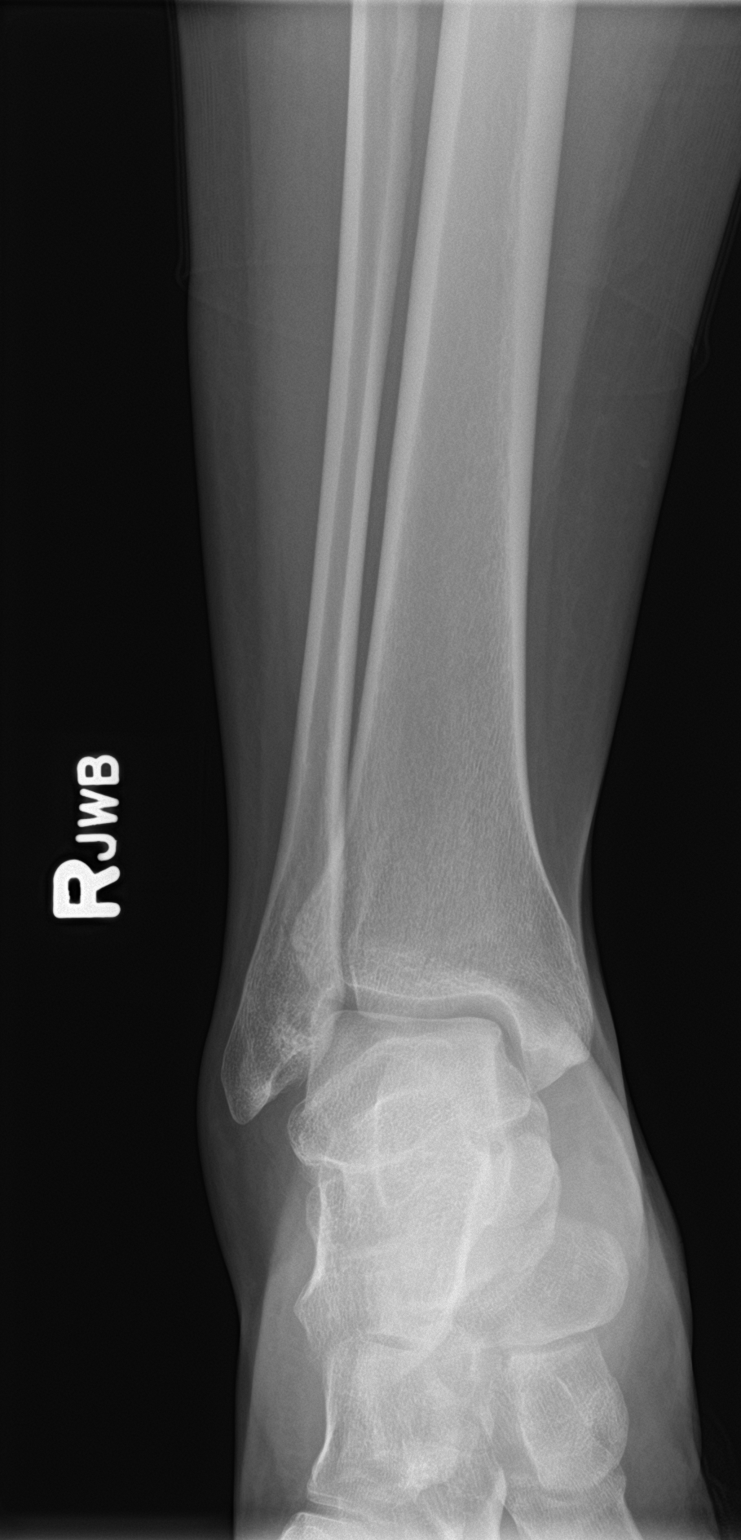

[ankle obl]
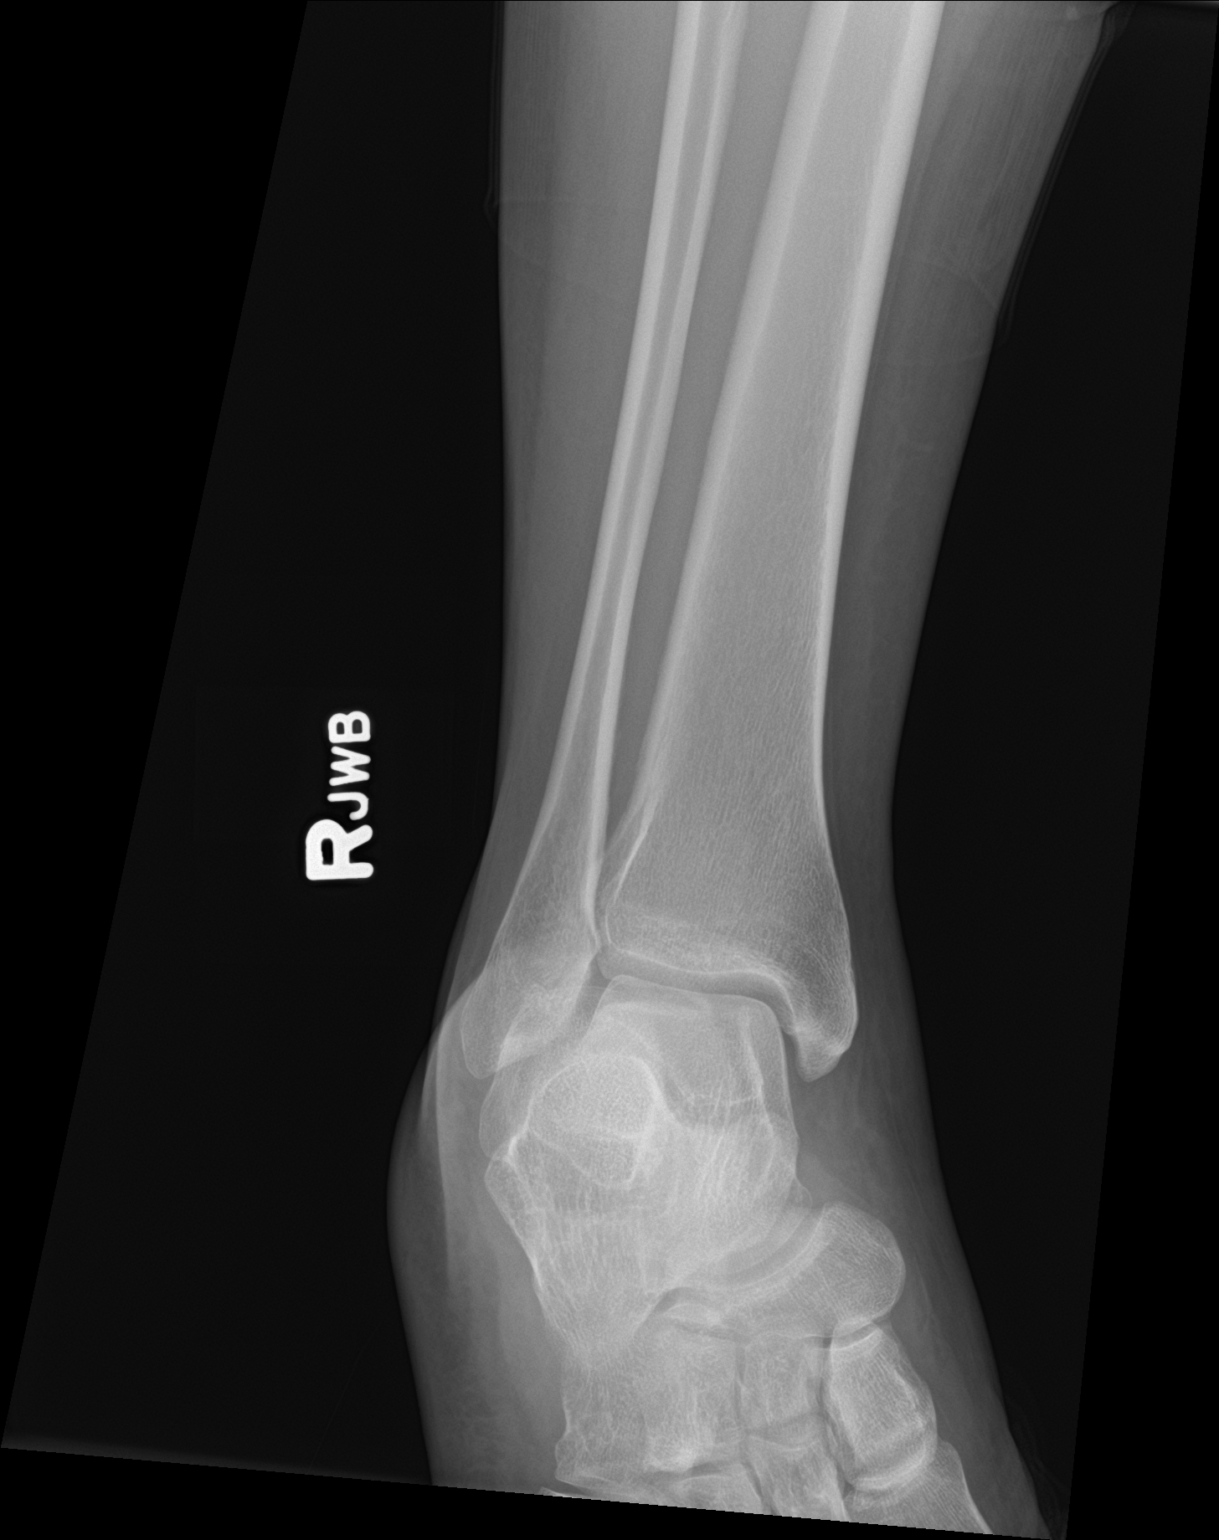

[ankle lat]
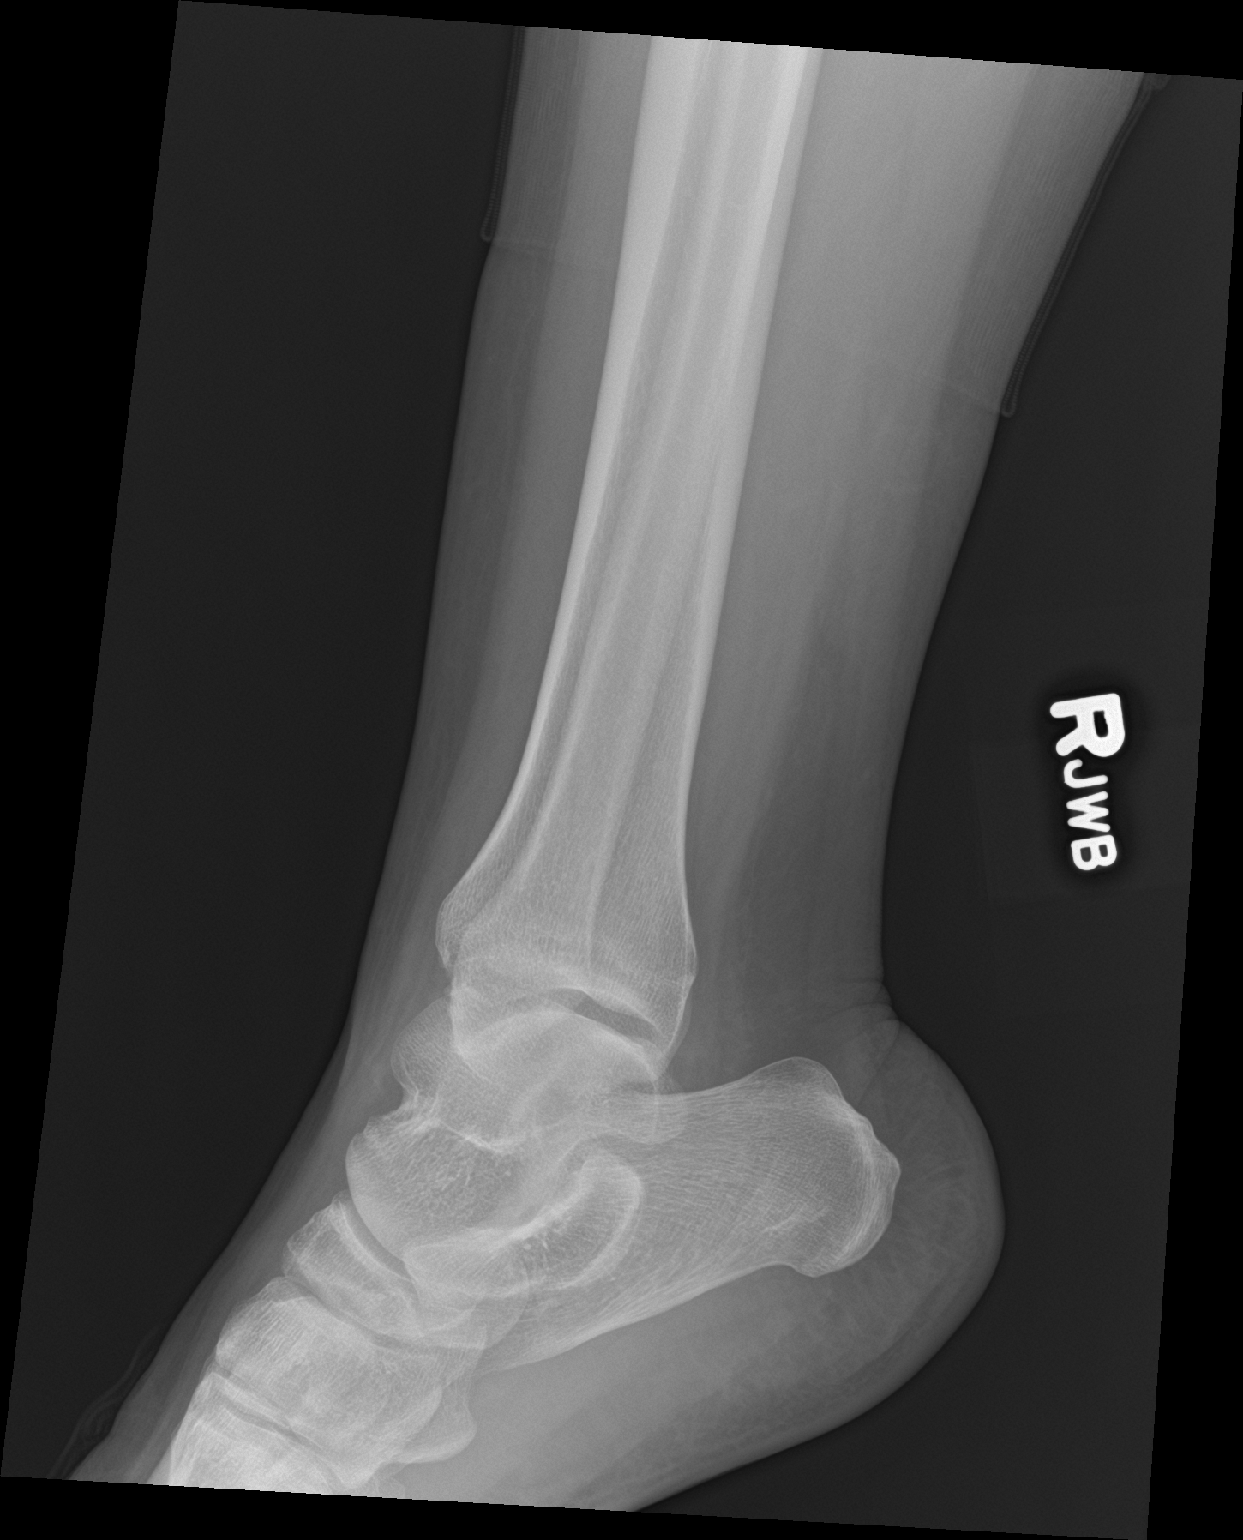

[3 of 3 positions shown; findings below may reference images not displayed]

FINDINGS: There is no evidence of transverse fracture, dislocation, or joint
effusion. There is slight irregularity on the lateral view,
involving the anterior talus but this cannot be confirmed on the AP
or oblique view. There is anterior soft tissue swelling. Anterior
soft tissue swelling.
IMPRESSION: Slight irregularity on the lateral view involving the anterior talus
but this cannot be confirmed on the AP or oblique view. Soft tissue
swelling. Consider repeat radiographs if pain persists at 7-10 days.

## 2021-02-20 ENCOUNTER — Encounter (HOSPITAL_COMMUNITY): Payer: Self-pay | Admitting: *Deleted

## 2021-02-20 ENCOUNTER — Other Ambulatory Visit: Payer: Self-pay

## 2021-02-20 ENCOUNTER — Emergency Department (HOSPITAL_COMMUNITY): Payer: Medicaid Other

## 2021-02-20 ENCOUNTER — Emergency Department (HOSPITAL_COMMUNITY)
Admission: EM | Admit: 2021-02-20 | Discharge: 2021-02-20 | Disposition: A | Discharge status: Home / Self Care | Payer: Medicaid Other | Attending: Emergency Medicine | Admitting: Emergency Medicine

## 2021-02-20 DIAGNOSIS — S61217A Laceration without foreign body of left little finger without damage to nail, initial encounter: Secondary | ICD-10-CM | POA: Diagnosis not present

## 2021-02-20 DIAGNOSIS — Z23 Encounter for immunization: Secondary | ICD-10-CM | POA: Insufficient documentation

## 2021-02-20 DIAGNOSIS — W260XXA Contact with knife, initial encounter: Secondary | ICD-10-CM | POA: Insufficient documentation

## 2021-02-20 DIAGNOSIS — S61012A Laceration without foreign body of left thumb without damage to nail, initial encounter: Secondary | ICD-10-CM | POA: Diagnosis not present

## 2021-02-20 DIAGNOSIS — S6992XA Unspecified injury of left wrist, hand and finger(s), initial encounter: Secondary | ICD-10-CM | POA: Diagnosis present

## 2021-02-20 MED ORDER — LIDOCAINE-EPINEPHRINE 1 %-1:100000 IJ SOLN
10.0000 mL | Freq: Once | INTRAMUSCULAR | Status: AC
  Administered 2021-02-20: 10 mL
  Filled 2021-02-20: qty 1

## 2021-02-20 MED ORDER — HYDROCODONE-ACETAMINOPHEN 5-325 MG PO TABS
1.0000 | ORAL_TABLET | Freq: Once | ORAL | Status: AC
  Administered 2021-02-20: 1 via ORAL
  Filled 2021-02-20: qty 1

## 2021-02-20 MED ORDER — TETANUS-DIPHTH-ACELL PERTUSSIS 5-2.5-18.5 LF-MCG/0.5 IM SUSY
0.5000 mL | PREFILLED_SYRINGE | Freq: Once | INTRAMUSCULAR | Status: AC
  Administered 2021-02-20: 0.5 mL via INTRAMUSCULAR
  Filled 2021-02-20: qty 0.5

## 2021-02-20 MED ORDER — LIDOCAINE-EPINEPHRINE-TETRACAINE (LET) TOPICAL GEL
3.0000 mL | Freq: Once | TOPICAL | Status: AC
  Administered 2021-02-20: 3 mL via TOPICAL
  Filled 2021-02-20: qty 3

## 2021-02-20 NOTE — ED Provider Notes (Signed)
[name] EMERGENCY DEPARTMENT Provider Note   CSN: [id number] Arrival date & time: 02/20/21  0126     History  Chief Complaint  Patient presents with   Laceration    Abigail Mays is a 36 y.o. female.  This is a 36 y.o. female with significant medical history as below, including Zaidi, depression who presents to the ED with complaint of finger injury.  Patient reports approximately midnight she was cutting vegetables with a kitchen knife, cut her pinky and thumb.  She briefly cleaned the wound.  Applied direct pressure.  No thinners.  Unsure last tetanus shot.  She is right-handed.  No numbness or tingling to affected hand, she has full range of motion to her hand although it is painful to range her fingers.  No nailbed damage.  Normal state of health prior to arrival, no other acute medical complaints offered.  Past Medical History: No date: Abnormal Pap smear No date: Anxiety No date: Chlamydia No date: Depression     Comment:  No meds  Past Surgical History: 06/25/2012: TUBAL LIGATION; Bilateral     Comment:  Procedure: POST PARTUM TUBAL LIGATION;  Surgeon: Reva Boresanya S              Pratt, MD;  Location: [facility];  Service: Gynecology;                Laterality: Bilateral;  with filshie clips    The history is provided by the patient. No language interpreter was used.  Laceration Associated symptoms: no fever and no rash       Home Medications Prior to Admission medications   Medication Sig Start Date End Date Taking? Authorizing Provider  acetaminophen (TYLENOL) 500 MG tablet Take 1,000 mg by mouth every 6 (six) hours as needed for mild pain.    [provider]  amoxicillin (AMOXIL) 500 MG capsule Take 1 capsule (500 mg total) by mouth 3 (three) times daily. 05/07/17   Janne NapoleonNeese, Hope M, NP  azithromycin (ZITHROMAX) 250 MG tablet Take 1 tablet (250 mg total) by mouth daily. Take first 2 tablets together, then 1 every day until finished.  02/21/17   Elvina SidleLauenstein, Kurt, MD  Multiple Vitamins-Minerals (MULTIVITAMIN WITH MINERALS) tablet Take 1 tablet by mouth daily.    [provider]  naproxen (NAPROSYN) 500 MG tablet Take 1 tablet (500 mg total) by mouth 2 (two) times daily. 05/07/17   Janne NapoleonNeese, Hope M, NP  NON FORMULARY     [provider]      Allergies    Patient has no known allergies.    Review of Systems   Review of Systems  Constitutional:  Negative for activity change and fever.  HENT:  Negative for facial swelling and trouble swallowing.   Eyes:  Negative for discharge and redness.  Respiratory:  Negative for cough and shortness of breath.   Cardiovascular:  Negative for chest pain and palpitations.  Gastrointestinal:  Negative for abdominal pain and nausea.  Genitourinary:  Negative for dysuria and flank pain.  Musculoskeletal:  Negative for back pain and gait problem.  Skin:  Positive for wound. Negative for pallor and rash.  Neurological:  Negative for syncope and headaches.   Physical Exam Updated Vital Signs BP 110/68 (BP Location: Right Arm)    Pulse 73    Temp 98.3 F (36.8 C) (Oral)    Resp 16    Ht 5' 5.25" (1.657 m)    Wt 87.1 kg  LMP 01/24/2021    SpO2 99%    BMI 31.71 kg/m  Physical Exam Vitals and nursing note reviewed.  Constitutional:      General: She is not in acute distress.    Appearance: Normal appearance.  HENT:     Head: Normocephalic and atraumatic. No raccoon eyes, Battle's sign, right periorbital erythema or left periorbital erythema.     Right Ear: External ear normal.     Left Ear: External ear normal.     Nose: Nose normal.     Mouth/Throat:     Mouth: Mucous membranes are moist.  Eyes:     General: No scleral icterus.       Right eye: No discharge.        Left eye: No discharge.  Cardiovascular:     Rate and Rhythm: Normal rate and regular rhythm.     Pulses: Normal pulses.     Heart sounds: Normal heart sounds.  Pulmonary:     Effort: Pulmonary effort  is normal. No respiratory distress.     Breath sounds: Normal breath sounds.  Abdominal:     General: Abdomen is flat.     Palpations: Abdomen is soft.     Tenderness: There is no abdominal tenderness.  Musculoskeletal:        General: Normal range of motion.     Cervical back: Normal range of motion.     Right lower leg: No edema.     Left lower leg: No edema.  Skin:    General: Skin is warm and dry.     Capillary Refill: Capillary refill takes less than 2 seconds.     Comments: Laceration to lateral aspect of first digit.  No active bleeding.  Second laceration to palmar aspect of fifth digit approximately.  Bleeding controlled.  Full range of motion to all 5 digits.  Cap refills brisk.  Neurovascular intact upper extremity.  2+ radial pulse affected extremity  No injury to ipsilateral wrist or elbow.  Neurological:     Mental Status: She is alert.  Psychiatric:        Mood and Affect: Mood normal.        Behavior: Behavior normal.    ED Results / Procedures / Treatments   Labs (all labs ordered are listed, but only abnormal results are displayed) Labs Reviewed - No data to display  EKG None  Radiology DG Hand Complete Left  Result Date: 02/20/2021 CLINICAL DATA:  Laceration of the digits. EXAM: LEFT HAND - COMPLETE 3+ VIEW COMPARISON:  None. FINDINGS: There is no evidence of fracture or dislocation. There is no evidence of arthropathy or other focal bone abnormality. Soft tissues are unremarkable. IMPRESSION: Negative. Electronically Signed   By: Elgie Collard M.D.   On: 02/20/2021 02:20    Procedures .Marland KitchenLaceration Repair  Date/Time: 02/20/2021 4:46 AM Performed by: Sloan Leiter, DO Authorized by: Sloan Leiter, DO   Consent:    Consent obtained:  Verbal   Risks, benefits, and alternatives were discussed: yes     Risks discussed:  Infection, pain, need for additional repair and poor cosmetic result   Alternatives discussed:  No treatment Universal protocol:     Procedure explained and questions answered to patient or proxy's satisfaction: yes     Immediately prior to procedure, a time out was called: yes     Patient identity confirmed:  Verbally with patient and provided demographic data Anesthesia:    Anesthesia method:  Topical application and local  infiltration   Topical anesthetic:  LET   Local anesthetic:  Lidocaine 1% WITH epi Laceration details:    Location:  Finger   Finger location:  L small finger   Length (cm):  1.5 Pre-procedure details:    Preparation:  Patient was prepped and draped in usual sterile fashion and imaging obtained to evaluate for foreign bodies Exploration:    Hemostasis achieved with:  Direct pressure   Imaging obtained: x-ray     Imaging outcome: foreign body not noted     Wound exploration: wound explored through full range of motion     Contaminated: no   Treatment:    Area cleansed with:  Povidone-iodine and saline   Amount of cleaning:  Standard   Irrigation solution:  Sterile saline   Irrigation volume:  30   Irrigation method:  Syringe   Debridement:  None   Undermining:  None   Scar revision: no   Skin repair:    Repair method:  Sutures   Suture size:  4-0   Wound skin closure material used: ethilon.   Suture technique:  Simple interrupted   Number of sutures:  7 Approximation:    Approximation:  Close Repair type:    Repair type:  Simple Post-procedure details:    Dressing:  Antibiotic ointment and non-adherent dressing   Procedure completion:  Tolerated well, no immediate complications .Marland KitchenLaceration Repair  Date/Time: 02/20/2021 4:47 AM Performed by: Sloan Leiter, DO Authorized by: Sloan Leiter, DO   Consent:    Consent obtained:  Verbal   Consent given by:  Patient   Risks, benefits, and alternatives were discussed: yes     Risks discussed:  Infection, need for additional repair and nerve damage   Alternatives discussed:  No treatment and delayed treatment Universal protocol:     Procedure explained and questions answered to patient or proxy's satisfaction: yes     Immediately prior to procedure, a time out was called: yes     Patient identity confirmed:  Verbally with patient and arm band Anesthesia:    Anesthesia method:  None Laceration details:    Location:  Finger   Finger location:  L thumb   Length (cm):  1.5 Pre-procedure details:    Preparation:  Patient was prepped and draped in usual sterile fashion and imaging obtained to evaluate for foreign bodies Exploration:    Limited defect created (wound extended): no     Hemostasis achieved with:  Direct pressure   Imaging obtained: x-ray     Imaging outcome: foreign body not noted     Wound exploration: wound explored through full range of motion     Contaminated: no   Treatment:    Area cleansed with:  Saline   Amount of cleaning:  Standard   Irrigation solution:  Sterile saline   Irrigation volume:  30   Irrigation method:  Pressure wash   Debridement:  None   Undermining:  None   Scar revision: no   Skin repair:    Repair method:  Tissue adhesive Approximation:    Approximation:  Close Repair type:    Repair type:  Simple Post-procedure details:    Dressing:  Open (no dressing)   Procedure completion:  Tolerated well, no immediate complications    Medications Ordered in ED Medications  lidocaine-EPINEPHrine (XYLOCAINE W/EPI) 1 %-1:100000 (with pres) injection 10 mL (has no administration in time range)  Tdap (BOOSTRIX) injection 0.5 mL (0.5 mLs Intramuscular Given 02/20/21 0204)  HYDROcodone-acetaminophen (NORCO/VICODIN)  5-325 MG per tablet 1 tablet (1 tablet Oral Given 02/20/21 0301)  lidocaine-EPINEPHrine-tetracaine (LET) topical gel (3 mLs Topical Given 02/20/21 0302)    ED Course/ Medical Decision Making/ A&P                           Medical Decision Making Amount and/or Complexity of Data Reviewed Radiology: ordered.  Risk Prescription drug management.    CC:  Laceration  This patient presents to the Emergency Department for the above complaint. This involves an extensive number of treatment options and is a complaint that carries with it a high risk of complications and morbidity. Vital signs were reviewed. Serious etiologies considered.  Record review:  Previous records obtained and reviewed   Additional history obtained from spouse  Medical and surgical history as noted above.   Work up as above, notable for:  imaging results that were available during my care of the patient were reviewed by me and considered in my medical decision making.   I ordered imaging studies which included hand XR and I independently visualized and interpreted imaging which showed no FB, non acute XR  Social determinants of health include - N/a  Management: Topical anesthetic, oral analgesic given.  We will proceed with laceration repair of left hand, consent obtained from patient directly  Tetanus updated  Reassessment:  Wound repaired, tolerated well, see proc note.    Patient presents with laceration to their 1st/5th fingers. Please see laceration repair procedure note for further details. Exam benign, neurovascularly intact with full range of motion in surrounding joints. Discussed suture care and removal with pt/family; notified to f/u with PCP to have sutures removed in 7-10 days; reviewed worsening s/s infection with verbalized understanding with pt/family.    Patient in no distress and overall condition improved here in the ED. Detailed discussions were had with the patient regarding current findings, and need for close f/u with PCP or on call doctor. The patient has been instructed to return immediately if the symptoms worsen in any way for re-evaluation. Patient verbalized understanding and is in agreement with current care plan. All questions answered prior to discharge.    This chart was dictated using voice recognition software.  Despite best  efforts to proofread,  errors can occur which can change the documentation meaning.         Final Clinical Impression(s) / ED Diagnoses Final diagnoses:  Laceration of left little finger without foreign body without damage to nail, initial encounter  Laceration of left thumb without foreign body without damage to nail, initial encounter    Rx / DC Orders ED Discharge Orders     None         Sloan Leiter, DO 02/20/21 [phone]

## 2021-02-20 NOTE — Discharge Instructions (Addendum)
Keep the wound clean and as dry as possible. Do not immerse or soak the wound in water. This means no swimming, washing dishes (unless thick rubber gloves are used), baths, or hot tubs until the stitches are removed or after about two weeks if absorbable suture material was used. Leave original bandages on the wound for the first 24 hours. After this time, showering or rinsing is recommended, rather than bathing. the first day, remove old bandages and gently cleanse the wound with soap and water. Cleansing twice a day prevents buildup of debris and will result in easier suture removal. Have the wound reevaluated for potential suture removal on the hand in 7 days-10 days

## 2021-02-20 NOTE — ED Triage Notes (Signed)
Pt was cutting food this morning and cut her L pinky and L thumb. Superficial cut noted to L thumb. Bleeding noted to L pinky. Unknown tetanus status

## 2021-03-04 ENCOUNTER — Other Ambulatory Visit: Payer: Self-pay

## 2021-03-04 ENCOUNTER — Emergency Department (HOSPITAL_COMMUNITY)
Admission: EM | Admit: 2021-03-04 | Discharge: 2021-03-04 | Disposition: A | Discharge status: Home / Self Care | Payer: Medicaid Other | Attending: Emergency Medicine | Admitting: Emergency Medicine

## 2021-03-04 DIAGNOSIS — Z4802 Encounter for removal of sutures: Secondary | ICD-10-CM | POA: Diagnosis present

## 2021-03-04 NOTE — ED Provider Notes (Signed)
°  [facility] EMERGENCY DEPARTMENT Provider Note   CSN: [id number] Arrival date & time: 03/04/21  1124     History  Chief Complaint  Patient presents with   sutrure removal    Abigail Mays is a 36 y.o. female.  The history is provided by the patient. No language interpreter was used.      Pt here to have sutures removed. Site clean, dry without appearing infectious.     Home Medications Prior to Admission medications   Medication Sig Start Date End Date Taking? Authorizing Provider  acetaminophen (TYLENOL) 500 MG tablet Take 1,000 mg by mouth every 6 (six) hours as needed for mild pain.    [provider]  amoxicillin (AMOXIL) 500 MG capsule Take 1 capsule (500 mg total) by mouth 3 (three) times daily. 05/07/17   Ashley Murrain, NP  azithromycin (ZITHROMAX) 250 MG tablet Take 1 tablet (250 mg total) by mouth daily. Take first 2 tablets together, then 1 every day until finished. 02/21/17   Robyn Haber, MD  Multiple Vitamins-Minerals (MULTIVITAMIN WITH MINERALS) tablet Take 1 tablet by mouth daily.    [provider]  naproxen (NAPROSYN) 500 MG tablet Take 1 tablet (500 mg total) by mouth 2 (two) times daily. 05/07/17   Ashley Murrain, NP  NON FORMULARY     [provider]      Allergies    Patient has no known allergies.    Review of Systems   Review of Systems  Physical Exam Updated Vital Signs BP 130/81 (BP Location: Right Arm)    Pulse 73    Temp 98.6 F (37 C) (Oral)    Resp 14    SpO2 98%  Physical Exam Skin:    Comments: L little finger: sutures to proximal phalanx (volar) in place, no evidence of infection, no tenderness    ED Results / Procedures / Treatments   Labs (all labs ordered are listed, but only abnormal results are displayed) Labs Reviewed - No data to display  EKG None  Radiology No results found.  Procedures Procedures    Medications Ordered in ED Medications - No data to  display  ED Course/ Medical Decision Making/ A&P                           Medical Decision Making  BP 130/81 (BP Location: Right Arm)    Pulse 73    Temp 98.6 F (37 C) (Oral)    Resp 14    SpO2 98%   12:15 PM SUTURE REMOVAL Performed by: Domenic Moras  Consent: Verbal consent obtained. Patient identity confirmed: provided demographic data Time out: Immediately prior to procedure a "time out" was called to verify the correct patient, procedure, equipment, support staff and site/side marked as required.  Location details: L little finger, volar  Wound Appearance: clean  Sutures/Staples Removed: sutures (6)  Facility: sutures placed in this facility Patient tolerance: Patient tolerated the procedure well with no immediate complications.          Final Clinical Impression(s) / ED Diagnoses Final diagnoses:  Encounter for removal of sutures    Rx / DC Orders ED Discharge Orders     None         Domenic Moras, PA-C 03/04/21 1218    Dorie Rank, MD 03/05/21 1505

## 2021-03-04 NOTE — ED Triage Notes (Signed)
Pt here to have sutures removed. Site clean, dry without appearing infectious.

## 2023-03-24 ENCOUNTER — Other Ambulatory Visit: Payer: Self-pay

## 2023-03-24 ENCOUNTER — Encounter (HOSPITAL_COMMUNITY): Payer: Self-pay | Admitting: *Deleted

## 2023-03-24 ENCOUNTER — Emergency Department (HOSPITAL_COMMUNITY)
Admission: EM | Admit: 2023-03-24 | Discharge: 2023-03-24 | Disposition: A | Discharge status: Home / Self Care | Payer: Self-pay | Attending: Student | Admitting: Student

## 2023-03-24 DIAGNOSIS — S61212A Laceration without foreign body of right middle finger without damage to nail, initial encounter: Secondary | ICD-10-CM | POA: Insufficient documentation

## 2023-03-24 DIAGNOSIS — W25XXXA Contact with sharp glass, initial encounter: Secondary | ICD-10-CM | POA: Insufficient documentation

## 2023-03-24 DIAGNOSIS — S61214A Laceration without foreign body of right ring finger without damage to nail, initial encounter: Secondary | ICD-10-CM | POA: Insufficient documentation

## 2023-03-24 DIAGNOSIS — Z23 Encounter for immunization: Secondary | ICD-10-CM | POA: Insufficient documentation

## 2023-03-24 DIAGNOSIS — S61219A Laceration without foreign body of unspecified finger without damage to nail, initial encounter: Secondary | ICD-10-CM

## 2023-03-24 MED ORDER — TETANUS-DIPHTH-ACELL PERTUSSIS 5-2.5-18.5 LF-MCG/0.5 IM SUSY
0.5000 mL | PREFILLED_SYRINGE | Freq: Once | INTRAMUSCULAR | Status: AC
  Administered 2023-03-24: 0.5 mL via INTRAMUSCULAR
  Filled 2023-03-24: qty 0.5

## 2023-03-24 MED ORDER — LIDOCAINE HCL (PF) 1 % IJ SOLN
10.0000 mL | Freq: Once | INTRAMUSCULAR | Status: AC
  Administered 2023-03-24: 10 mL
  Filled 2023-03-24: qty 10

## 2023-03-24 NOTE — ED Provider Notes (Signed)
 [facility] Provider Note   CSN: [id number] Arrival date & time: 03/24/23  1216     History  Chief Complaint  Patient presents with   Laceration    Abigail Mays is a 38 y.o. female no significant past medical history is reporting to the emergency room for laceration.  Patient has palmar sided proximal laceration to right second and third digit.  She has normal range of motion and bleeding is controlled.  Patient reports that earlier today when she was handling a glass broke cutting her skin.  It is unknown when her last tetanus shot was.  She has not had any prior surgery to this hand.  Does not report any significant discomfort.   Laceration      Home Medications Prior to Admission medications   Medication Sig Start Date End Date Taking? Authorizing Provider  acetaminophen (TYLENOL) 500 MG tablet Take 1,000 mg by mouth every 6 (six) hours as needed for mild pain.    [provider]  amoxicillin (AMOXIL) 500 MG capsule Take 1 capsule (500 mg total) by mouth 3 (three) times daily. 05/07/17   Janne Napoleon, NP  azithromycin (ZITHROMAX) 250 MG tablet Take 1 tablet (250 mg total) by mouth daily. Take first 2 tablets together, then 1 every day until finished. 02/21/17   Elvina Sidle, MD  Multiple Vitamins-Minerals (MULTIVITAMIN WITH MINERALS) tablet Take 1 tablet by mouth daily.    [provider]  naproxen (NAPROSYN) 500 MG tablet Take 1 tablet (500 mg total) by mouth 2 (two) times daily. 05/07/17   Janne Napoleon, NP  NON FORMULARY     [provider]      Allergies    Patient has no known allergies.    Review of Systems   Review of Systems  Skin:  Positive for wound.    Physical Exam Updated Vital Signs BP 128/86 (BP Location: Left Arm)   Temp 99.2 F (37.3 C) (Oral)   Resp 18   Ht 5\' 7"  (1.702 m)   Wt 90.7 kg   SpO2 100%   BMI 31.32 kg/m  Physical Exam Vitals and nursing note reviewed.   Constitutional:      General: She is not in acute distress.    Appearance: She is not toxic-appearing.  HENT:     Head: Normocephalic and atraumatic.  Eyes:     General: No scleral icterus.    Conjunctiva/sclera: Conjunctivae normal.  Cardiovascular:     Rate and Rhythm: Normal rate and regular rhythm.     Pulses: Normal pulses.     Heart sounds: Normal heart sounds.  Pulmonary:     Effort: Pulmonary effort is normal. No respiratory distress.     Breath sounds: Normal breath sounds.  Abdominal:     General: Abdomen is flat. Bowel sounds are normal.     Palpations: Abdomen is soft.     Tenderness: There is no abdominal tenderness.  Skin:    General: Skin is warm and dry.     Findings: No lesion.     Comments: Laceration to proximal fourth and third digit on palmar aspect .  Patient neurovascularly intact.  Has normal range of motion of PIP and DIP.  Neurological:     General: No focal deficit present.     Mental Status: She is alert and oriented to person, place, and time. Mental status is at baseline.     ED Results / Procedures / Treatments  Labs (all labs ordered are listed, but only abnormal results are displayed) Labs Reviewed - No data to display  EKG None  Radiology No results found.  Procedures .Laceration Repair  Date/Time: 03/24/2023 5:35 PM  Performed by: Smitty Knudsen, PA-C Authorized by: Smitty Knudsen, PA-C   Consent:    Consent obtained:  Verbal   Consent given by:  Patient   Risks, benefits, and alternatives were discussed: yes     Risks discussed:  Infection, nerve damage, need for additional repair, pain, poor cosmetic result, poor wound healing, tendon damage, retained foreign body and vascular damage   Alternatives discussed:  Referral, observation, delayed treatment and no treatment Universal protocol:    Procedure explained and questions answered to patient or proxy's satisfaction: yes     Relevant documents present and verified: yes      Test results available: yes     Imaging studies available: yes     Required blood products, implants, devices, and special equipment available: yes     Site/side marked: yes     Immediately prior to procedure, a time out was called: yes     Patient identity confirmed:  Verbally with patient Anesthesia:    Anesthesia method:  Local infiltration   Local anesthetic:  Lidocaine 1% w/o epi Laceration details:    Location:  Finger   Finger location:  R long finger   Length (cm):  1.5 Pre-procedure details:    Preparation:  Patient was prepped and draped in usual sterile fashion Exploration:    Limited defect created (wound extended): no   Treatment:    Area cleansed with:  Povidone-iodine   Amount of cleaning:  Standard   Irrigation solution:  Sterile saline   Irrigation method:  Pressure wash   Visualized foreign bodies/material removed: no     Debridement:  None Skin repair:    Repair method:  Sutures   Suture size:  5-0   Suture material:  Nylon   Suture technique:  Simple interrupted   Number of sutures:  4 Approximation:    Approximation:  Close Repair type:    Repair type:  Simple Post-procedure details:    Dressing:  Open (no dressing)   Procedure completion:  Tolerated .Laceration Repair  Date/Time: 03/24/2023 5:36 PM  Performed by: Smitty Knudsen, PA-C Authorized by: Smitty Knudsen, PA-C   Consent:    Consent obtained:  Verbal   Consent given by:  Patient   Risks discussed:  Infection, need for additional repair, nerve damage, poor wound healing, poor cosmetic result, pain, retained foreign body, tendon damage and vascular damage   Alternatives discussed:  No treatment Universal protocol:    Procedure explained and questions answered to patient or proxy's satisfaction: yes     Relevant documents present and verified: yes     Test results available: yes     Imaging studies available: yes     Required blood products, implants, devices, and special  equipment available: yes     Site/side marked: yes     Immediately prior to procedure, a time out was called: yes     Patient identity confirmed:  Verbally with patient Anesthesia:    Anesthesia method:  Local infiltration   Local anesthetic:  Lidocaine 1% w/o epi Laceration details:    Location:  Finger   Finger location:  R ring finger   Length (cm):  1.5 Skin repair:    Repair method:  Sutures   Suture size:  5-0  Suture material:  Nylon   Suture technique:  Simple interrupted   Number of sutures:  4 Approximation:    Approximation:  Close Repair type:    Repair type:  Simple Post-procedure details:    Dressing:  Open (no dressing)   Procedure completion:  Tolerated     Medications Ordered in ED Medications  lidocaine (PF) (XYLOCAINE) 1 % injection 10 mL (has no administration in time range)  Tdap (BOOSTRIX) injection 0.5 mL (has no administration in time range)    ED Course/ Medical Decision Making/ A&P                                 Medical Decision Making Risk Prescription drug management.   Blakley S Quiros 38 y.o. presented today for a 1 cm laceration to their R 4th and 3rd digit. Working Ddx: FB, fracture, NV compromise, simple laceration.  R/o DDx: These ddx are considered less likely due to history of present illness and physical exam findings.  Pmhx considered: None  Patient presented for a 1 cm laceration to their 4th and 3rd right fingers. They are neurovascularly intact. Tetanus is going to get UTD. Patient is in no distress. Laceration will be repaired with standard wound care procedures and antibiotic ointment.      Problem List / ED Course / Critical interventions / Medication management  Patient reporting to emergency room with laceration to second third digit.  This is superficial.  She is neurovascularly intact.  I do not suspect any tendon involvement.  When we flushed thoroughly to rule out foreign body.  Patient is overall  hemodynamically stable and well-appearing with no other associated injuries. Laceration was repaired without any complication.  Tolerated procedure well.  No obvious foreign body.  No tendon involvement.  Neurovascularly intact.  She is very well-appearing. I ordered medication including lidocaine Reevaluation of the patient after these medicines showed that the patient improved Patients vitals assessed. Upon arrival patient is  hemodynamically stable.  I have reviewed the patients home medicines and have made adjustments as needed     Plan:  F/u for suture removal in 7-10 days.  Patient is stable for discharge at this time. Patient/family expressed understanding of return precautions and need for follow-up.  Keep wound clean, covered. Educated on sx of concern regarding complications -- such as infection, poor wound healing, compartment sx.          Final Clinical Impression(s) / ED Diagnoses Final diagnoses:  Laceration of finger of right hand without foreign body without damage to nail, unspecified finger, initial encounter    Rx / DC Orders ED Discharge Orders     None         Reinaldo Raddle 03/24/23 1738    Glendora Score, MD 03/25/23 2108

## 2023-03-24 NOTE — ED Triage Notes (Signed)
 States she was holding a glass cup and it broke in her hand laceratin to right middle and ring finger bleeding controled

## 2023-03-24 NOTE — Discharge Instructions (Addendum)
 Please follow-up with your primary care doctor or an urgent care doctor to have sutures removed in 7 to 10 days.  You can apply naproxen to the wound twice daily once in the morning once in the evening.  Please keep area clean dry and covered.  You can wash with gentle soap and water.  Just make sure you do not submerge in water.  Return to emergency room if you have signs of infection like poor wound healing or surrounding redness.  You had 8 sutures placed.

## 2023-04-13 ENCOUNTER — Encounter (HOSPITAL_COMMUNITY): Payer: Self-pay | Admitting: *Deleted

## 2023-04-13 ENCOUNTER — Ambulatory Visit (HOSPITAL_COMMUNITY): Admission: EM | Admit: 2023-04-13 | Discharge: 2023-04-13 | Disposition: A | Discharge status: Home / Self Care

## 2023-04-13 DIAGNOSIS — Z4802 Encounter for removal of sutures: Secondary | ICD-10-CM | POA: Diagnosis not present

## 2023-04-13 NOTE — ED Triage Notes (Signed)
 Pt states here for suture removal. She has 4 sutures in her right middle finger and 4 sutures in her right ring finger they were placed on 03/24/2023

## 2023-04-13 NOTE — ED Provider Notes (Signed)
 [facility]    CSN: [id number] Arrival date & time: 04/13/23  0830      History   Chief Complaint Chief Complaint  Patient presents with   Suture / Staple Removal    HPI Abigail Mays is a 38 y.o. female.   Patient presents to clinic for suture removal.  She had a total of 8 sutures placed in the emergency department on 3/15 and was advised to return to urgent care for removal in 7 to 10 days.  She presents today, 20 days later.  Has not had any drainage from the site.  Reports the areas have mostly healed.  Denies any tenderness or redness.  Denies fevers.  The history is provided by medical records and the patient.  Suture / Staple Removal    Past Medical History:  Diagnosis Date   Abnormal Pap smear    Anxiety    Chlamydia    Depression    No meds    Patient Active Problem List   Diagnosis Date Noted   Encounter for sterilization 06/25/2012   Normal delivery 06/25/2012   Insufficient prenatal care 05/30/2012   Depressive disorder, not elsewhere classified 05/30/2012    Past Surgical History:  Procedure Laterality Date   TUBAL LIGATION Bilateral 06/25/2012   Procedure: POST PARTUM TUBAL LIGATION;  Surgeon: Reva Bores, MD;  Location: [facility];  Service: Gynecology;  Laterality: Bilateral;  with filshie clips    OB History     Gravida  4   Para  4   Term  3   Preterm  1   AB      Living  4      SAB      IAB      Ectopic      Multiple      Live Births  1            Home Medications    Prior to Admission medications   Medication Sig Start Date End Date Taking? Authorizing Provider  acetaminophen (TYLENOL) 500 MG tablet Take 1,000 mg by mouth every 6 (six) hours as needed for mild pain.    [provider]  amoxicillin (AMOXIL) 500 MG capsule Take 1 capsule (500 mg total) by mouth 3 (three) times daily. 05/07/17   Janne Napoleon, NP  azithromycin (ZITHROMAX) 250 MG tablet Take 1 tablet (250 mg total) by mouth  daily. Take first 2 tablets together, then 1 every day until finished. 02/21/17   Elvina Sidle, MD  Multiple Vitamins-Minerals (MULTIVITAMIN WITH MINERALS) tablet Take 1 tablet by mouth daily.    [provider]  naproxen (NAPROSYN) 500 MG tablet Take 1 tablet (500 mg total) by mouth 2 (two) times daily. 05/07/17   Janne Napoleon, NP  NON FORMULARY     [provider]    Family History Family History  Problem Relation Age of Onset   Healthy Mother     Social History Social History   Tobacco Use   Smoking status: Former    Current packs/day: 0.00    Types: Cigarettes    Quit date: 01/24/2012    Years since quitting: 11.2   Smokeless tobacco: Never  Substance Use Topics   Alcohol use: Yes   Drug use: No    Types: Marijuana    Comment: Stopped smoking marijuana     Allergies   Patient has no known allergies.   Review of Systems Review of Systems  Per HPI  Physical Exam Triage Vital Signs ED Triage Vitals  Encounter Vitals Group     BP 04/13/23 0920 109/66     Systolic BP Percentile --      Diastolic BP Percentile --      Pulse Rate 04/13/23 0920 68     Resp 04/13/23 0920 16     Temp 04/13/23 0920 98.7 F (37.1 C)     Temp Source 04/13/23 0920 Oral     SpO2 04/13/23 0920 96 %     Weight --      Height --      Head Circumference --      Peak Flow --      Pain Score 04/13/23 0919 0     Pain Loc --      Pain Education --      Exclude from Growth Chart --    No data found.  Updated Vital Signs BP 109/66 (BP Location: Right Arm)   Pulse 68   Temp 98.7 F (37.1 C) (Oral)   Resp 16   LMP 03/19/2023 (Approximate)   SpO2 96%   Visual Acuity Right Eye Distance:   Left Eye Distance:   Bilateral Distance:    Right Eye Near:   Left Eye Near:    Bilateral Near:     Physical Exam Vitals and nursing note reviewed.  Constitutional:      Appearance: Normal appearance.  HENT:     Head: Normocephalic.     Right Ear: External ear normal.      Left Ear: External ear normal.     Nose: Nose normal.     Mouth/Throat:     Mouth: Mucous membranes are moist.  Eyes:     Conjunctiva/sclera: Conjunctivae normal.  Cardiovascular:     Rate and Rhythm: Normal rate.  Pulmonary:     Effort: Pulmonary effort is normal. No respiratory distress.  Skin:    General: Skin is warm and dry.  Neurological:     General: No focal deficit present.     Mental Status: She is alert.  Psychiatric:        Mood and Affect: Mood normal.      UC Treatments / Results  Labs (all labs ordered are listed, but only abnormal results are displayed) Labs Reviewed - No data to display  EKG   Radiology No results found.  Procedures Procedures (including critical care time)  Medications Ordered in UC Medications - No data to display  Initial Impression / Assessment and Plan / UC Course  I have reviewed the triage vital signs and the nursing notes.  Pertinent labs & imaging results that were available during my care of the patient were reviewed by me and considered in my medical decision making (see chart for details).  Vitals in triage reviewed, patient is hemodynamically stable.  Edges well-approximated at both suture sites.  A total of 8 sutures removed, 4 from each finger of the right middle and right ring removed.  Appears to have healed well, no active signs of infection.  Suture removal wound care discussed.  Plan of care, follow-up care and return precautions given, no questions at this time.     Final Clinical Impressions(s) / UC Diagnoses   Final diagnoses:  Visit for suture removal   Discharge Instructions   None    ED Prescriptions   None    PDMP not reviewed this encounter.   Rinaldo Ratel [name], [place] 04/13/23 [phone]

## 2023-10-07 ENCOUNTER — Encounter (HOSPITAL_COMMUNITY): Payer: Self-pay

## 2023-10-07 ENCOUNTER — Ambulatory Visit (HOSPITAL_COMMUNITY): Admission: EM | Admit: 2023-10-07 | Discharge: 2023-10-07 | Disposition: A | Discharge status: Home / Self Care

## 2023-10-07 DIAGNOSIS — S61217A Laceration without foreign body of left little finger without damage to nail, initial encounter: Secondary | ICD-10-CM | POA: Diagnosis not present

## 2023-10-07 MED ORDER — LIDOCAINE HCL (PF) 2 % IJ SOLN
INTRAMUSCULAR | Status: AC
  Filled 2023-10-07: qty 5

## 2023-10-07 NOTE — Discharge Instructions (Addendum)
 Laceration repair done today of the left little finger.  There are 7 sutures in place.  Would return to clinic in 7 days for suture removal.  Leave the current dressing in place until tomorrow then may remove and wash the area with soap and water.  Do not submerge the area completely underwater.  Keep a dry dressing on the area for the first 3 to 4 days and wear the finger splint.  After this you may remove the finger splint and leave the area open to air.  May use Tylenol  or ibuprofen  for pain.  Tetanus is up-to-date.  Return to clinic prior to surgical removal if there is significant redness, drainage, pain or other signs of infection.

## 2023-10-07 NOTE — ED Triage Notes (Signed)
 Patient here toiday with c/o a laceration to left 5th finger today. Patient was cutting sausage and slipped and cut her finger with the knife. Last Tdap 03/24/2023.

## 2023-10-07 NOTE — ED Provider Notes (Signed)
 [facility]    CSN: [id number] Arrival date & time: 10/07/23  1651      History   Chief Complaint Chief Complaint  Patient presents with   Laceration    HPI Abigail Mays is a 38 y.o. female.   38 year old female presents urgent care with complaints of a laceration on the left little finger.  She reports this happened this morning when she was cutting sausage.  She cleaned the area and put a dressing in place but had to continue cooking at that time.  She has kept the area clean and covered since then.  She denies any loss of sensation in the tip of the finger.  She is able to move her finger through full range of motion.  Her last Tdap was last year.   Laceration Associated symptoms: no fever and no rash     Past Medical History:  Diagnosis Date   Abnormal Pap smear    Anxiety    Chlamydia    Depression    No meds    Patient Active Problem List   Diagnosis Date Noted   Encounter for sterilization 06/25/2012   Normal delivery 06/25/2012   Insufficient prenatal care 05/30/2012   Depressive disorder, not elsewhere classified 05/30/2012    Past Surgical History:  Procedure Laterality Date   TUBAL LIGATION Bilateral 06/25/2012   Procedure: POST PARTUM TUBAL LIGATION;  Surgeon: Glenys GORMAN Birk, MD;  Location: [facility];  Service: Gynecology;  Laterality: Bilateral;  with filshie clips    OB History     Gravida  4   Para  4   Term  3   Preterm  1   AB      Living  4      SAB      IAB      Ectopic      Multiple      Live Births  1            Home Medications    Prior to Admission medications   Medication Sig Start Date End Date Taking? Authorizing Provider  tiZANidine (ZANAFLEX) 4 MG tablet Take 4 mg by mouth at bedtime as needed. 10/03/23  Yes [provider]    Family History Family History  Problem Relation Age of Onset   Healthy Mother     Social History Social History   Tobacco Use   Smoking status:  Former    Current packs/day: 0.00    Types: Cigarettes    Quit date: 01/24/2012    Years since quitting: 11.7   Smokeless tobacco: Never  Substance Use Topics   Alcohol use: Yes   Drug use: No    Types: Marijuana    Comment: Stopped smoking marijuana     Allergies   Patient has no known allergies.   Review of Systems Review of Systems  Constitutional:  Negative for chills and fever.  HENT:  Negative for ear pain and sore throat.   Eyes:  Negative for pain and visual disturbance.  Respiratory:  Negative for cough and shortness of breath.   Cardiovascular:  Negative for chest pain and palpitations.  Gastrointestinal:  Negative for abdominal pain and vomiting.  Genitourinary:  Negative for dysuria and hematuria.  Musculoskeletal:  Negative for arthralgias and back pain.  Skin:  Positive for wound (Left little finger). Negative for color change and rash.  Neurological:  Negative for seizures and syncope.  All other systems reviewed and are negative.  Physical Exam Triage Vital Signs ED Triage Vitals  Encounter Vitals Group     BP 10/07/23 1755 122/79     Girls Systolic BP Percentile --      Girls Diastolic BP Percentile --      Boys Systolic BP Percentile --      Boys Diastolic BP Percentile --      Pulse Rate 10/07/23 1755 64     Resp 10/07/23 1755 16     Temp 10/07/23 1755 98.2 F (36.8 C)     Temp Source 10/07/23 1755 Oral     SpO2 10/07/23 1755 96 %     Weight --      Height --      Head Circumference --      Peak Flow --      Pain Score 10/07/23 1751 7     Pain Loc --      Pain Education --      Exclude from Growth Chart --    No data found.  Updated Vital Signs BP 122/79 (BP Location: Right Arm)   Pulse 64   Temp 98.2 F (36.8 C) (Oral)   Resp 16   LMP 09/23/2023 (Approximate)   SpO2 96%   Visual Acuity Right Eye Distance:   Left Eye Distance:   Bilateral Distance:    Right Eye Near:   Left Eye Near:    Bilateral Near:     Physical  Exam Vitals and nursing note reviewed.  Constitutional:      General: She is not in acute distress.    Appearance: She is well-developed.  HENT:     Head: Normocephalic and atraumatic.  Eyes:     Conjunctiva/sclera: Conjunctivae normal.  Cardiovascular:     Rate and Rhythm: Normal rate and regular rhythm.  Pulmonary:     Effort: Pulmonary effort is normal. No respiratory distress.  Musculoskeletal:        General: No swelling.       Hands:     Cervical back: Neck supple.  Skin:    General: Skin is warm and dry.     Capillary Refill: Capillary refill takes less than 2 seconds.  Neurological:     Mental Status: She is alert.  Psychiatric:        Mood and Affect: Mood normal.      UC Treatments / Results  Labs (all labs ordered are listed, but only abnormal results are displayed) Labs Reviewed - No data to display  EKG   Radiology No results found.  Procedures Laceration Repair  Date/Time: 10/07/2023 6:54 PM  Performed by: Teresa Almarie LABOR, PA-C Authorized by: Teresa Almarie LABOR, PA-C   Consent:    Consent obtained:  Verbal   Consent given by:  Patient   Risks discussed:  Infection, need for additional repair, pain, poor cosmetic result and poor wound healing   Alternatives discussed:  No treatment and delayed treatment Universal protocol:    Procedure explained and questions answered to patient or proxy's satisfaction: yes     Relevant documents present and verified: yes     Site/side marked: yes     Immediately prior to procedure, a time out was called: yes     Patient identity confirmed:  Verbally with patient Anesthesia:    Anesthesia method:  Local infiltration   Local anesthetic:  Lidocaine  1% w/o epi Laceration details:    Location:  Finger   Finger location:  L small finger   Length (cm):  2   Depth (mm):  3 Pre-procedure details:    Preparation:  Patient was prepped and draped in usual sterile fashion Exploration:    Wound exploration: wound  explored through full range of motion   Treatment:    Area cleansed with:  Povidone-iodine   Amount of cleaning:  Standard   Irrigation method:  Syringe   Debridement:  None Skin repair:    Repair method:  Sutures   Suture size:  5-0   Suture technique:  Simple interrupted   Number of sutures:  7 Approximation:    Approximation:  Close Repair type:    Repair type:  Simple Post-procedure details:    Dressing:  Splint for protection and bulky dressing   Procedure completion:  Tolerated well, no immediate complications  (including critical care time)  Medications Ordered in UC Medications - No data to display  Initial Impression / Assessment and Plan / UC Course  I have reviewed the triage vital signs and the nursing notes.  Pertinent labs & imaging results that were available during my care of the patient were reviewed by me and considered in my medical decision making (see chart for details).     Laceration of left little finger without foreign body without damage to nail, initial encounter   Laceration repair done today of the left little finger.  There are 7 sutures in place.  Would return to clinic in 7 days for suture removal.  Leave the current dressing in place until tomorrow then may remove and wash the area with soap and water.  Do not submerge the area completely underwater.  Keep a dry dressing on the area for the first 3 to 4 days and wear the finger splint.  After this you may remove the finger splint and leave the area open to air.  May use Tylenol  or ibuprofen  for pain.  Tetanus is up-to-date.  Return to clinic prior to surgical removal if there is significant redness, drainage, pain or other signs of infection.  Final Clinical Impressions(s) / UC Diagnoses   Final diagnoses:  Laceration of left little finger without foreign body without damage to nail, initial encounter     Discharge Instructions      Laceration repair done today of the left little finger.   There are 7 sutures in place.  Would return to clinic in 7 days for suture removal.  Leave the current dressing in place until tomorrow then may remove and wash the area with soap and water.  Do not submerge the area completely underwater.  Keep a dry dressing on the area for the first 3 to 4 days and wear the finger splint.  After this you may remove the finger splint and leave the area open to air.  May use Tylenol  or ibuprofen  for pain.  Tetanus is up-to-date.  Return to clinic prior to surgical removal if there is significant redness, drainage, pain or other signs of infection.    ED Prescriptions   None    PDMP not reviewed this encounter.   Teresa Almarie LABOR, [place] 10/07/23 1857

## 2023-10-11 ENCOUNTER — Ambulatory Visit (HOSPITAL_COMMUNITY)
Admission: RE | Admit: 2023-10-11 | Discharge: 2023-10-11 | Disposition: A | Discharge status: Home / Self Care | Source: Ambulatory Visit | Attending: Family Medicine | Admitting: Family Medicine

## 2023-10-11 VITALS — BP 111/64 | HR 63 | Temp 98.1°F | Resp 17

## 2023-10-11 DIAGNOSIS — L732 Hidradenitis suppurativa: Secondary | ICD-10-CM

## 2023-10-11 IMAGING — DX DG HAND COMPLETE 3+V*L*
1 series · 3 of 3 positions shown · non-contrast
Comparison: None.

CLINICAL DATA: Laceration of the digits.

EXAM:
LEFT HAND - COMPLETE 3+ VIEW

[Series 1: hand · 0.14mm/px · 3 of 3 slices shown]
[im 1/3]
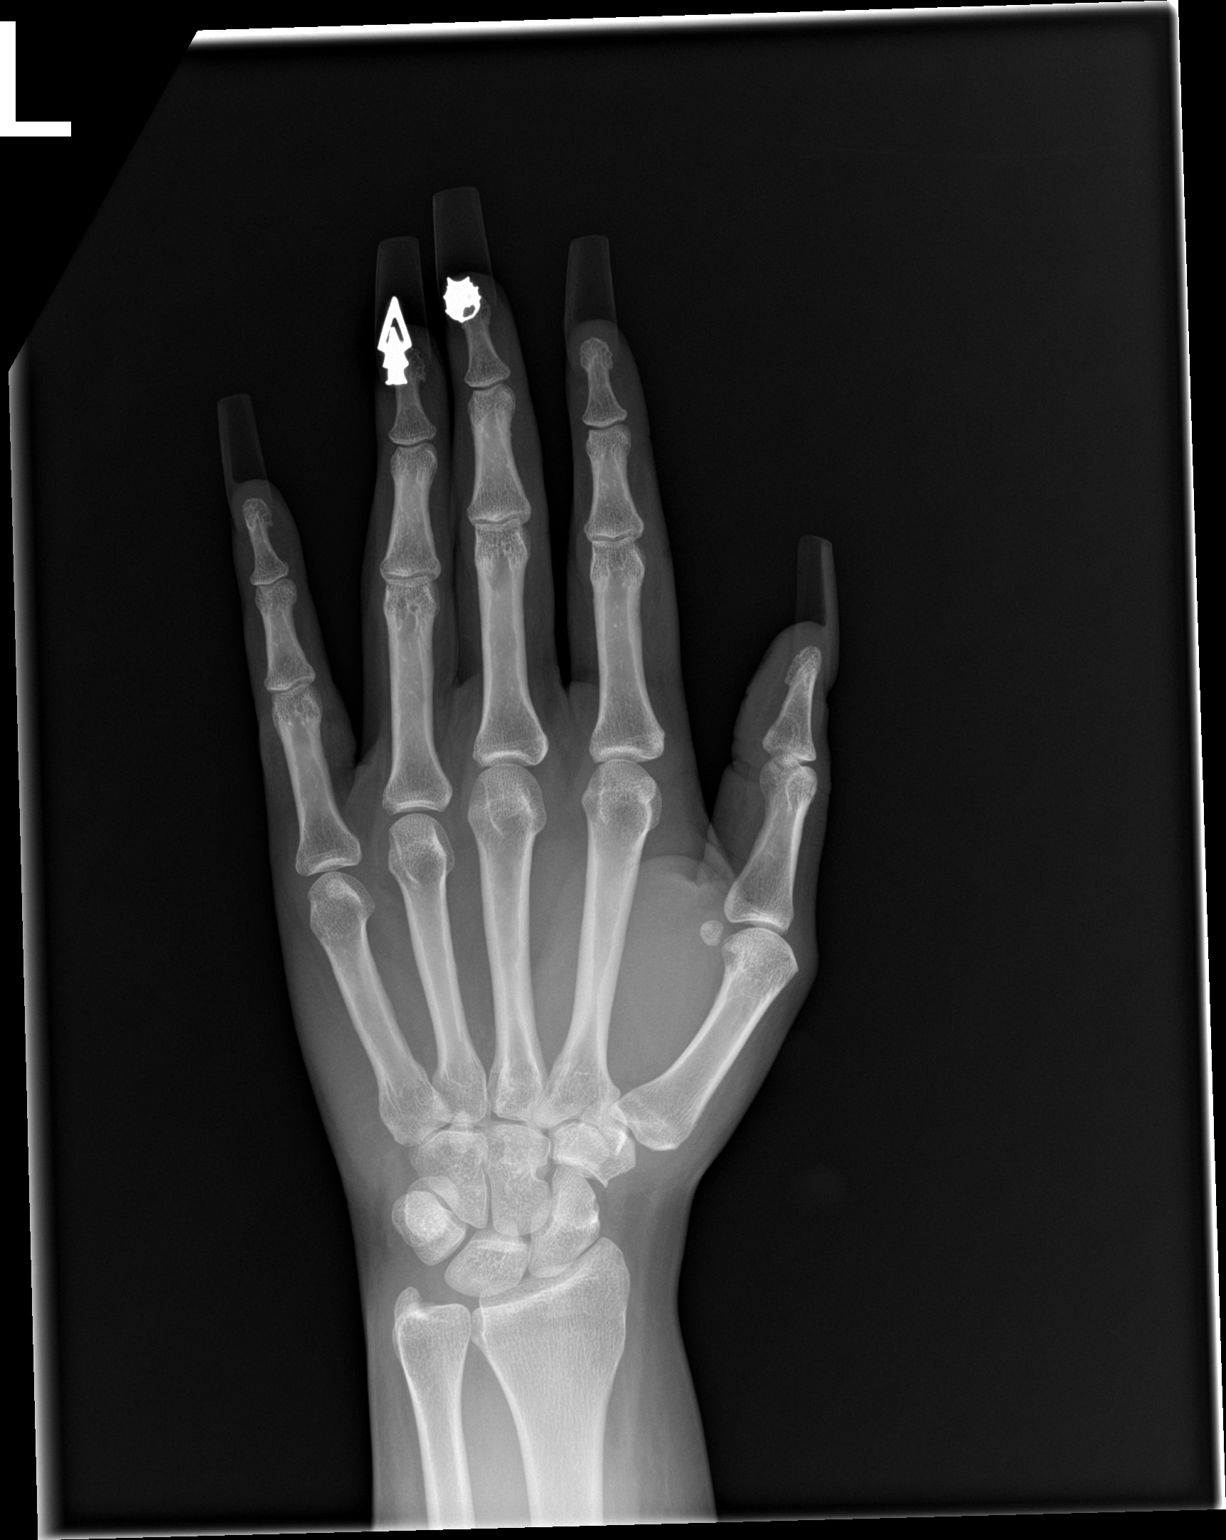
[im 2/3]
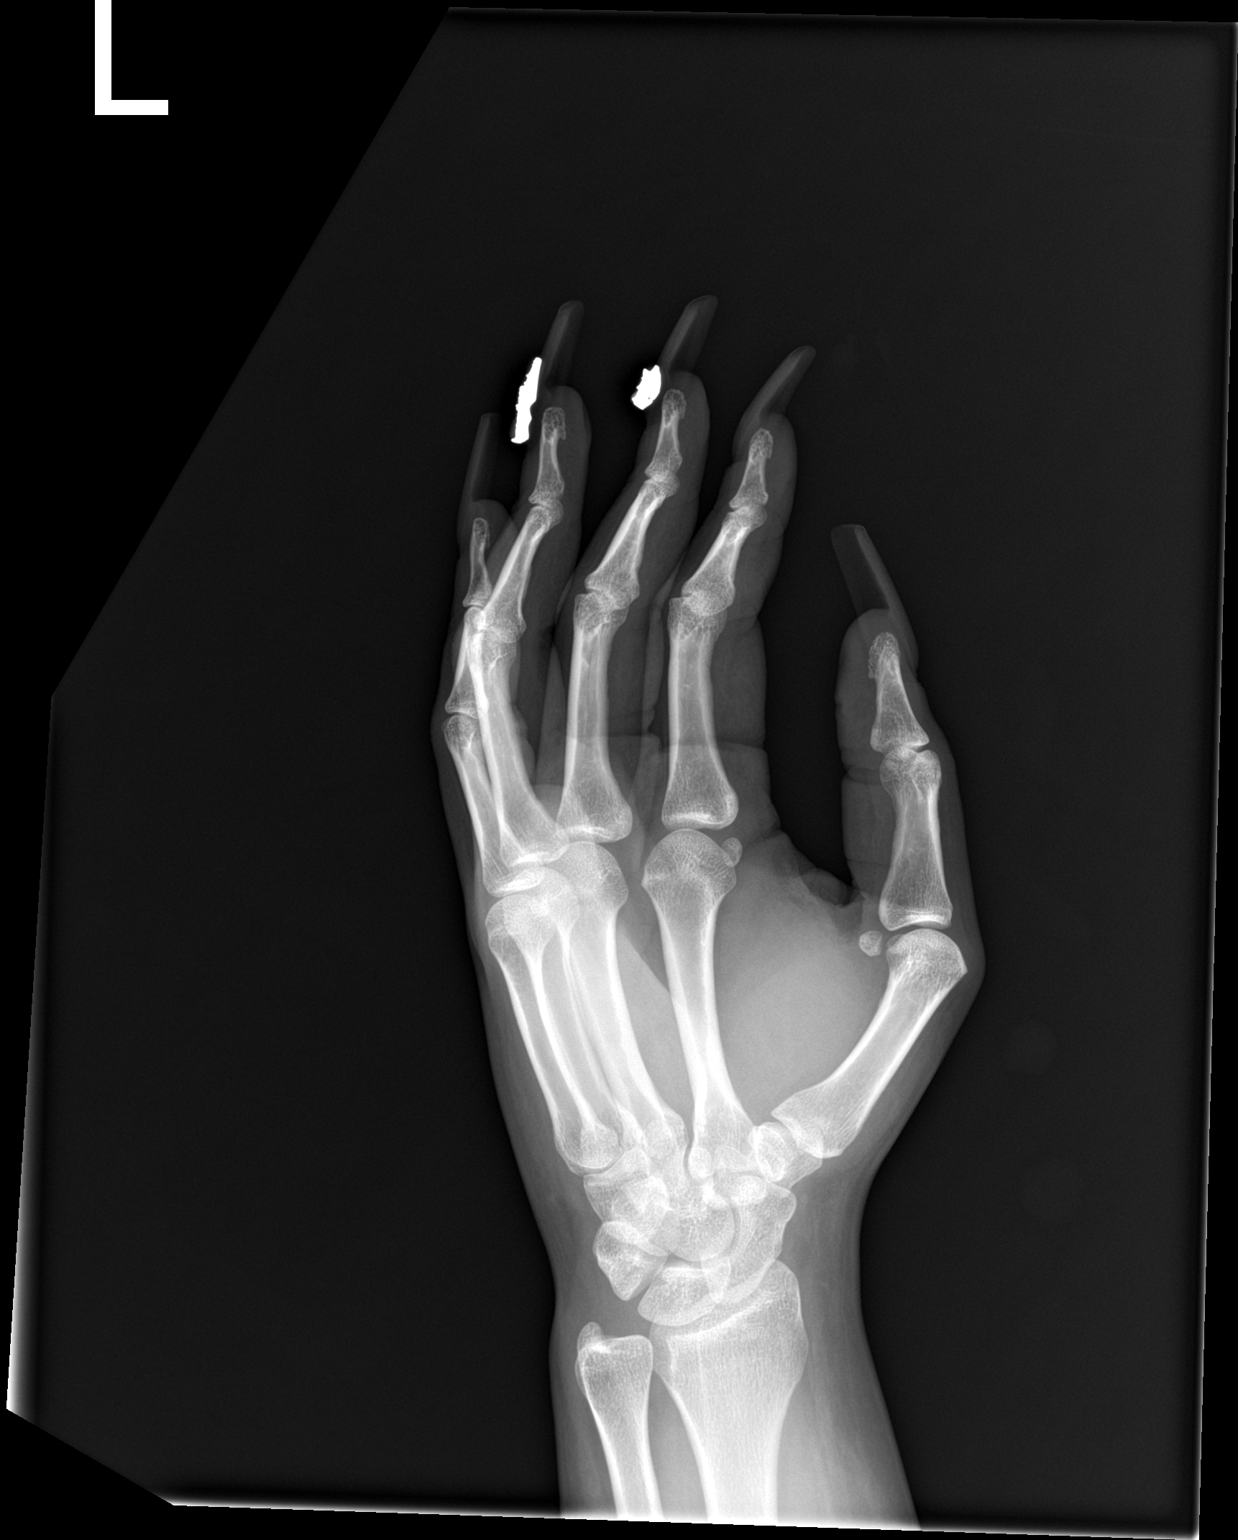
[im 3/3]
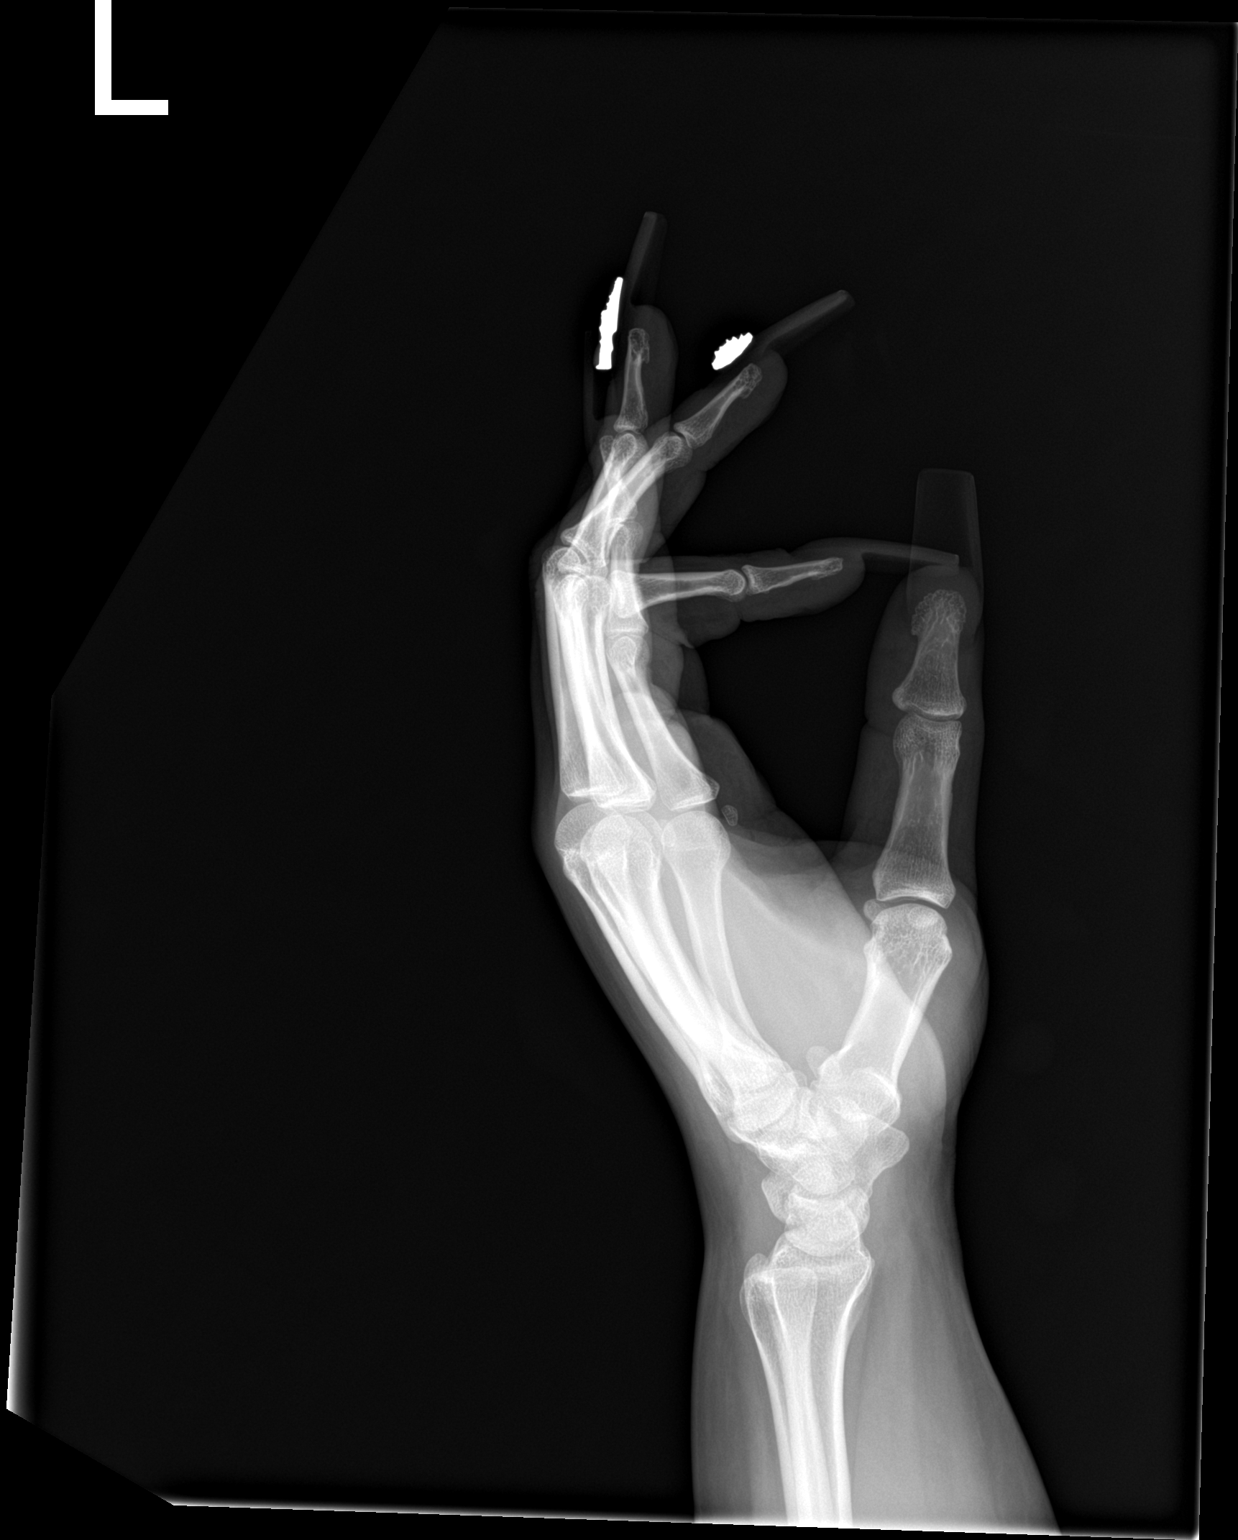

[3 of 3 positions shown; findings below may reference images not displayed]

FINDINGS: There is no evidence of fracture or dislocation. There is no
evidence of arthropathy or other focal bone abnormality. Soft
tissues are unremarkable.
IMPRESSION: Negative.

## 2023-10-11 MED ORDER — DOXYCYCLINE HYCLATE 100 MG PO CAPS: 100.0000 mg | ORAL_CAPSULE | Freq: Two times a day (BID) | ORAL | 0 refills | Status: DC

## 2023-10-11 NOTE — ED Provider Notes (Signed)
 [facility]    CSN: [id number] Arrival date & time: 10/11/23  1519      History   Chief Complaint Chief Complaint  Patient presents with   Abscess    HPI Abigail Mays is a 38 y.o. female.   Reports intermittent  boils in bilateral axilla for years.  A few days ago had flare in left axilla, with some pustules and drainage.  Tender to touch.  She is shaving in this area, which we discussed temporarily discontinuing.  No systemic symptoms.  Patient had bilateral tubal ligation.   Abscess   Past Medical History:  Diagnosis Date   Abnormal Pap smear    Anxiety    Chlamydia    Depression    No meds    Patient Active Problem List   Diagnosis Date Noted   Encounter for sterilization 06/25/2012   Normal delivery 06/25/2012   Insufficient prenatal care 05/30/2012   Depressive disorder, not elsewhere classified 05/30/2012    Past Surgical History:  Procedure Laterality Date   TUBAL LIGATION Bilateral 06/25/2012   Procedure: POST PARTUM TUBAL LIGATION;  Surgeon: Glenys GORMAN Birk, MD;  Location: [facility];  Service: Gynecology;  Laterality: Bilateral;  with filshie clips    OB History     Gravida  4   Para  4   Term  3   Preterm  1   AB      Living  4      SAB      IAB      Ectopic      Multiple      Live Births  1            Home Medications    Prior to Admission medications   Medication Sig Start Date End Date Taking? Authorizing Provider  tiZANidine (ZANAFLEX) 4 MG tablet Take 4 mg by mouth at bedtime as needed. 10/03/23   [provider]    Family History Family History  Problem Relation Age of Onset   Healthy Mother     Social History Social History   Tobacco Use   Smoking status: Former    Current packs/day: 0.00    Types: Cigarettes    Quit date: 01/24/2012    Years since quitting: 11.7   Smokeless tobacco: Never  Substance Use Topics   Alcohol use: Yes   Drug use: No    Types: Marijuana     Comment: Stopped smoking marijuana     Allergies   Patient has no known allergies.   Review of Systems Review of Systems   Physical Exam Triage Vital Signs ED Triage Vitals  Encounter Vitals Group     BP 10/11/23 1536 111/64     Girls Systolic BP Percentile --      Girls Diastolic BP Percentile --      Boys Systolic BP Percentile --      Boys Diastolic BP Percentile --      Pulse Rate 10/11/23 1536 63     Resp 10/11/23 1536 17     Temp 10/11/23 1536 98.1 F (36.7 C)     Temp Source 10/11/23 1536 Oral     SpO2 10/11/23 1536 99 %     Weight --      Height --      Head Circumference --      Peak Flow --      Pain Score 10/11/23 1535 8     Pain Loc --  Pain Education --      Exclude from Growth Chart --    No data found.  Updated Vital Signs BP 111/64 (BP Location: Right Arm)   Pulse 63   Temp 98.1 F (36.7 C) (Oral)   Resp 17   LMP 09/23/2023 (Approximate)   SpO2 99%   Visual Acuity Right Eye Distance:   Left Eye Distance:   Bilateral Distance:    Right Eye Near:   Left Eye Near:    Bilateral Near:     Physical Exam Cardiovascular:     Rate and Rhythm: Normal rate and regular rhythm.     Heart sounds: Normal heart sounds.  Pulmonary:     Effort: Pulmonary effort is normal.     Breath sounds: Normal breath sounds.  Skin:    Comments: 3 pustules 0.5-1 cm each, in left axilla, tender, without active drainage.  Nonfluctuant.  No erythema/extension to surrounding tissue.      UC Treatments / Results  Labs (all labs ordered are listed, but only abnormal results are displayed) Labs Reviewed - No data to display  EKG   Radiology No results found.  Procedures Procedures (including critical care time)  Medications Ordered in UC Medications - No data to display  Initial Impression / Assessment and Plan / UC Course  I have reviewed the triage vital signs and the nursing notes.  Pertinent labs & imaging results that were available during my  care of the patient were reviewed by me and considered in my medical decision making (see chart for details).     Well-appearing, afebrile, benign exam.  History/appearance highly suspicious for hidradenitis suppurativa.  Counseled on topical washes such as chlorhexidine wash.  Additionally, will prescribe 49-month course of doxycycline 100 mg twice daily (counseled on sun exposure).  Discussed establishing care with PCP, provided instructions.  Follow-up and return precautions discussed.   Final Clinical Impressions(s) / UC Diagnoses   Final diagnoses:  None   Discharge Instructions   None    ED Prescriptions   None    PDMP not reviewed this encounter.   Howell Lunger, [place] 10/11/23 1601

## 2023-10-11 NOTE — Discharge Instructions (Signed)
-   Please take one tablet of doxycycline twice daily for 2 months - Please establish care with PCP, you can scan the QR code on the last page of this packet to find a new PCP

## 2023-10-11 NOTE — ED Triage Notes (Signed)
 Pt presents to UC for multiple boils under left arm x 2/3 months. Reports the boils are under the right arm. Reports the boils usually go away on their own.

## 2023-11-15 ENCOUNTER — Ambulatory Visit: Admitting: Family Medicine

## 2023-11-15 VITALS — BP 128/84 | HR 74 | Ht 66.0 in | Wt 215.0 lb

## 2023-11-15 DIAGNOSIS — L732 Hidradenitis suppurativa: Secondary | ICD-10-CM

## 2023-11-15 DIAGNOSIS — M545 Low back pain, unspecified: Secondary | ICD-10-CM

## 2023-11-15 DIAGNOSIS — L709 Acne, unspecified: Secondary | ICD-10-CM

## 2023-11-15 MED ORDER — TIZANIDINE HCL 4 MG PO TABS: 4.0000 mg | ORAL_TABLET | Freq: Four times a day (QID) | ORAL | 0 refills | Status: AC | PRN

## 2023-11-15 MED ORDER — MELOXICAM 15 MG PO TABS: 15.0000 mg | ORAL_TABLET | Freq: Every day | ORAL | 0 refills | Status: AC

## 2023-11-15 NOTE — Progress Notes (Signed)
   Name: Abigail Mays   Date of Visit: 11/15/23   Date of last visit with me: Visit date not found   CHIEF COMPLAINT:  Chief Complaint  Patient presents with   Annual Exam    Physical and  back pain. Went to urgent care for arm and said she had HS        HPI:  Discussed the use of AI scribe software for clinical note transcription with the patient, who gave verbal consent to proceed.  History of Present Illness Abigail Mays is a 38 year old female who presents with back pain.  She has been experiencing back pain for the past seven months, localized to the left side of her lower back without radiation to the leg. The pain is described as aching and sometimes severe enough to wake her at night. She initially received tizanidine from a clinic, but no follow-up or imaging studies were conducted. Ibuprofen  600 mg provides occasional relief, but not consistently.  She has a history of hidradenitis suppurativa, previously managed with doxycycline, which she discontinued due to gastrointestinal side effects. Her symptoms have resolved with topical treatments and hygiene measures.  Recently, she developed facial acne, which began about a month ago and was not present while on doxycycline. The acne began about a month ago and was not present while on doxycycline. She has oily skin and has been using a product not suitable for facial use.     OBJECTIVE:       11/15/2023    4:18 PM  Depression screen PHQ 2/9  Decreased Interest 0  Down, Depressed, Hopeless 0  PHQ - 2 Score 0     BP Readings from Last 3 Encounters:  11/15/23 128/84  10/11/23 111/64  10/07/23 122/79    BP 128/84   Pulse 74   Ht 5' 6 (1.676 m)   Wt 215 lb (97.5 kg)   SpO2 98%   BMI 34.70 kg/m    Physical Exam    Physical Exam  TTP over the lumbar paraspinals on the right side at L3-/L5. No spinal tenderness. No sciatica syptoms, straight leg negative.  ASSESSMENT/PLAN:   Assessment &  Plan Lumbar back pain    Assessment and Plan Assessment & Plan Low back pain Chronic left-sided low back pain disrupting sleep. Previous tizanidine provided partial relief; ibuprofen  inconsistent. - Ordered x-rays at [facility]. - Prescribed meloxicam  once daily with food for two weeks, then as needed. - Refilled tizanidine with caution for drowsiness. - Provided home/gym exercises available on YouTube. - Scheduled follow-up in six weeks to review x-rays and assess progress.  Hidradenitis suppurativa Condition resolved with hygiene and topical oil. Discussed recurrence potential and hygiene importance. - Advised strict hygiene to prevent recurrence. - Discussed long-term antibiotic therapy if condition recurs.  Acne Recent acne likely hormonal, related to menstrual cycle. No prior history. - Advised CeraVe face wash for oily skin. - Will reassess after next menstrual cycle to determine medication necessity.     Grainger Mccarley A. Vita MD [facility]

## 2023-11-15 NOTE — Patient Instructions (Addendum)
 Please go get your xrays done at [facility]. You do not need to make an appointment. You can just show up.   Address: [address] [place], [place], [place] [address]  You have been diagnosed with a lumbar strain, which is a common cause of lower back pain resulting from overstretched or injured muscles. This condition often improves with conservative management.  Treatment Plan:  Medication:  Meloxicam  (NSAID): Take one tablet daily for 14 days, then use as needed for pain or inflammation. Take with food to reduce stomach upset.  Tizanidine: Take as needed for muscle spasms, especially at night. Do not drive or operate heavy machinery while taking this medication, as it may cause drowsiness.  Heat Therapy:  Apply heat to the lower back area for 15-20 minutes at a time, 2-3 times daily, especially in the morning or before bed. Use a heating pad or warm compress. Avoid direct contact with the skin to prevent burns.  Activity:  Avoid heavy lifting, twisting motions, or prolonged sitting.  Gentle movement and walking are encouraged to prevent stiffness.  Avoid bed rest beyond short periods, as it can delay recovery.  Follow-Up:  If symptoms persist beyond 2 weeks, worsen, or if you develop numbness, weakness, or loss of bowel/bladder control, seek medical attention promptly.   You can Youtube any of the following excercises.  1. Cat-Cow Stretch  Start on hands and knees.  Slowly arch your back up (like a cat), then drop your belly down (like a cow).  Repeat 10-12 times.  2. Child's Pose to [employer]  From hands and knees, sit back toward your heels, then move forward and lift your chest slightly.  Repeat 5-8 times for gentle mobility.  3. Bird Dog  On hands and knees, lift one arm and the opposite leg, keeping your back flat.  Hold 3 seconds, then switch sides.  Do 10 reps per side.  4. Dead Bug  Lie on your back with arms up and knees bent 90.  Slowly  lower one arm and opposite leg, keeping your low back flat on the floor.  Return to start and switch sides.  Do 10-12 reps per side.  5. Salley Brion Bene on your back with knees bent and feet flat.  Lift your hips until shoulders-hips-knees form a straight line.  Hold 3 seconds, lower slowly.  Do 3 sets of 10-15 reps.  6. Plank or Side Plank  Hold your body straight on elbows/toes (or side).  Start with 20-30 seconds, increase as tolerated.  7. Back Extensions (if available)  Use a Roman chair or stability ball.  Lift your chest slightly while keeping your spine neutral.  3 sets of 12-15 reps.  Tips:  Focus on good form and slow control.  Avoid pain or strain.  Strengthen your glutes and abs to support your lower back.  Consistency matters more than intensity.

## 2023-11-20 ENCOUNTER — Ambulatory Visit: Payer: Self-pay | Admitting: Family Medicine

## 2023-12-17 ENCOUNTER — Ambulatory Visit: Admitting: Family Medicine

## 2023-12-28 ENCOUNTER — Encounter: Payer: Self-pay | Admitting: Family Medicine
